# Patient Record
Sex: Female | Born: 1990 | Race: Black or African American | Hispanic: No | Marital: Married | Smoking: Never smoker
Health system: Southern US, Community
[De-identification: ages and names within clinical notes are randomized; demographics above are authoritative.]

## PROBLEM LIST (undated history)

## (undated) ENCOUNTER — Emergency Department (HOSPITAL_COMMUNITY): Admission: EM | Payer: Managed Care, Other (non HMO) | Source: Home / Self Care

## (undated) DIAGNOSIS — Z973 Presence of spectacles and contact lenses: Secondary | ICD-10-CM

## (undated) DIAGNOSIS — J45909 Unspecified asthma, uncomplicated: Secondary | ICD-10-CM

## (undated) DIAGNOSIS — O139 Gestational [pregnancy-induced] hypertension without significant proteinuria, unspecified trimester: Secondary | ICD-10-CM

## (undated) DIAGNOSIS — I2699 Other pulmonary embolism without acute cor pulmonale: Secondary | ICD-10-CM

## (undated) DIAGNOSIS — I10 Essential (primary) hypertension: Secondary | ICD-10-CM

## (undated) DIAGNOSIS — N879 Dysplasia of cervix uteri, unspecified: Secondary | ICD-10-CM

## (undated) HISTORY — PX: NO PAST SURGERIES: SHX2092

---
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
## 2019-10-10 ENCOUNTER — Emergency Department (HOSPITAL_BASED_OUTPATIENT_CLINIC_OR_DEPARTMENT_OTHER)
Admission: EM | Admit: 2019-10-10 | Discharge: 2019-10-10 | Disposition: A | Discharge status: Home / Self Care | Payer: Managed Care, Other (non HMO) | Attending: Emergency Medicine | Admitting: Emergency Medicine

## 2019-10-10 ENCOUNTER — Other Ambulatory Visit: Payer: Self-pay

## 2019-10-10 ENCOUNTER — Encounter (HOSPITAL_BASED_OUTPATIENT_CLINIC_OR_DEPARTMENT_OTHER): Payer: Self-pay

## 2019-10-10 DIAGNOSIS — Z3A01 Less than 8 weeks gestation of pregnancy: Secondary | ICD-10-CM

## 2019-10-10 DIAGNOSIS — R519 Headache, unspecified: Secondary | ICD-10-CM | POA: Diagnosis not present

## 2019-10-10 DIAGNOSIS — O99891 Other specified diseases and conditions complicating pregnancy: Secondary | ICD-10-CM | POA: Diagnosis present

## 2019-10-10 DIAGNOSIS — I10 Essential (primary) hypertension: Secondary | ICD-10-CM

## 2019-10-10 DIAGNOSIS — O10011 Pre-existing essential hypertension complicating pregnancy, first trimester: Secondary | ICD-10-CM | POA: Insufficient documentation

## 2019-10-10 LAB — PREGNANCY, URINE: Preg Test, Ur: POSITIVE — AB

## 2019-10-10 MED ORDER — LABETALOL HCL 100 MG PO TABS: 100.0000 mg | ORAL_TABLET | Freq: Two times a day (BID) | ORAL | 0 refills | Status: DC

## 2019-10-10 MED ORDER — METOCLOPRAMIDE HCL 10 MG PO TABS: 10.0000 mg | ORAL_TABLET | Freq: Three times a day (TID) | ORAL | 0 refills | Status: DC | PRN

## 2019-10-10 NOTE — ED Triage Notes (Signed)
C/o HA x 1-2 weeks-sates she has no hx except with HTN during pregnancy ~1.5 yrs ago-she has not had f/u-NAD-steady gait

## 2019-10-12 NOTE — ED Provider Notes (Signed)
[facility] EMERGENCY DEPARTMENT Provider Note   CSN: [id number] Arrival date & time: 10/10/19  1721     History   Chief Complaint Chief Complaint  Patient presents with  . Headache    HPI Laura Sutton is a 28 y.o. female.     HPI   28 year old female with multiple complaints.  Mainly concerned that she is pregnant.  She reports a headache for the past 1 to 2 weeks.  She feels like she might have high blood pressure.  She reports history of hypertension during pregnancy but resolved after delivery.  Last period at approximately 6 weeks ago.  No fevers or chills.  No visual complaints.  She has been nauseated.  No vomiting.  History reviewed. No pertinent past medical history.  There are no active problems to display for this patient.   History reviewed. No pertinent surgical history.   OB History   No obstetric history on file.      Home Medications    Prior to Admission medications   Medication Sig Start Date End Date Taking? Authorizing Provider  labetalol (NORMODYNE) 100 MG tablet Take 1 tablet (100 mg total) by mouth 2 (two) times daily. 10/10/19   Raeford Razor, MD  metoCLOPramide (REGLAN) 10 MG tablet Take 1 tablet (10 mg total) by mouth every 8 (eight) hours as needed for nausea. 10/10/19   Raeford Razor, MD    Family History No family history on file.  Social History Social History   Tobacco Use  . Smoking status: Never Smoker  . Smokeless tobacco: Never Used  Substance Use Topics  . Alcohol use: Never    Frequency: Never  . Drug use: Never     Allergies   Patient has no known allergies.   Review of Systems Review of Systems  All systems reviewed and negative, other than as noted in HPI.  Physical Exam Updated Vital Signs BP (!) 140/117 (BP Location: Right Arm)   Pulse 90   Temp 99.2 F (37.3 C) (Oral)   Resp 20   Ht 5\' 3"  (1.6 m)   Wt 67.6 kg   LMP 09/02/2019   SpO2 100%   BMI 26.39 kg/m   Physical Exam  Vitals signs and nursing note reviewed.  Constitutional:      General: She is not in acute distress.    Appearance: She is well-developed.  HENT:     Head: Normocephalic and atraumatic.  Eyes:     General:        Right eye: No discharge.        Left eye: No discharge.     Conjunctiva/sclera: Conjunctivae normal.  Neck:     Musculoskeletal: Neck supple. No neck rigidity.  Cardiovascular:     Rate and Rhythm: Normal rate and regular rhythm.     Heart sounds: Normal heart sounds. No murmur. No friction rub. No gallop.   Pulmonary:     Effort: Pulmonary effort is normal. No respiratory distress.     Breath sounds: Normal breath sounds.  Abdominal:     General: There is no distension.     Palpations: Abdomen is soft.     Tenderness: There is no abdominal tenderness.  Musculoskeletal:        General: No tenderness.  Lymphadenopathy:     Cervical: No cervical adenopathy.  Skin:    General: Skin is warm and dry.  Neurological:     General: No focal deficit present.     Mental Status: She  is alert. She is disoriented.     Cranial Nerves: No cranial nerve deficit.     Sensory: No sensory deficit.     Motor: No weakness.     Coordination: Coordination normal.  Psychiatric:        Behavior: Behavior normal.        Thought Content: Thought content normal.      ED Treatments / Results  Labs (all labs ordered are listed, but only abnormal results are displayed) Labs Reviewed  PREGNANCY, URINE - Abnormal; Notable for the following components:      Result Value   Preg Test, Ur POSITIVE (*)    All other components within normal limits    EKG None  Radiology No results found.  Procedures Procedures (including critical care time)  Medications Ordered in ED Medications - No data to display   Initial Impression / Assessment and Plan / ED Course  I have reviewed the triage vital signs and the nursing notes.  Pertinent labs & imaging results that were available during my  care of the patient were reviewed by me and considered in my medical decision making (see chart for details).        28 year old female with headache, hypertension and pregnancy.  Approximate 6 weeks by dates.  History of hypertension during previous pregnancy.  Will place her on labetalol for the time being.  She needs to establish care with new OB.  Neuro exam is nonfocal.  Afebrile.  No meningismus.  Final Clinical Impressions(s) / ED Diagnoses   Final diagnoses:  Hypertension, unspecified type  Less than [redacted] weeks gestation of pregnancy  Nonintractable headache, unspecified chronicity pattern, unspecified headache type    ED Discharge Orders         Ordered    labetalol (NORMODYNE) 100 MG tablet  2 times daily     10/10/19 1838    metoCLOPramide (REGLAN) 10 MG tablet  Every 8 hours PRN     10/10/19 Chapman Fitch, MD 10/12/19 2049

## 2019-10-26 LAB — OB RESULTS CONSOLE ABO/RH: RH Type: POSITIVE

## 2019-10-26 LAB — OB RESULTS CONSOLE RUBELLA ANTIBODY, IGM: Rubella: IMMUNE

## 2019-10-26 LAB — OB RESULTS CONSOLE GC/CHLAMYDIA
Chlamydia: NEGATIVE
Gonorrhea: NEGATIVE

## 2019-10-26 LAB — OB RESULTS CONSOLE HEPATITIS B SURFACE ANTIGEN: Hepatitis B Surface Ag: NEGATIVE

## 2019-10-26 LAB — OB RESULTS CONSOLE HIV ANTIBODY (ROUTINE TESTING): HIV: NONREACTIVE

## 2019-10-26 LAB — OB RESULTS CONSOLE RPR: RPR: NONREACTIVE

## 2019-10-26 LAB — OB RESULTS CONSOLE ANTIBODY SCREEN: Antibody Screen: NEGATIVE

## 2019-12-21 NOTE — L&D Delivery Note (Signed)
Patient was C/C/+2 and pushed for 5  minutes with epidural.    NSVD female infant, Apgars pending NICU assignment, weight 4#3.   The patient had no laceration. Fundus was firm. EBL was expected amount. Placenta was delivered intact. Vagina was clear.  Delayed cord clamping not performed, cord clamped cut and baby handed off immediately to NICU.   Philip Aspen

## 2020-05-01 ENCOUNTER — Inpatient Hospital Stay (HOSPITAL_BASED_OUTPATIENT_CLINIC_OR_DEPARTMENT_OTHER): Payer: Managed Care, Other (non HMO)

## 2020-05-01 ENCOUNTER — Encounter (HOSPITAL_COMMUNITY): Payer: Self-pay | Admitting: Obstetrics and Gynecology

## 2020-05-01 ENCOUNTER — Observation Stay (HOSPITAL_COMMUNITY)
Admission: AD | Admit: 2020-05-01 | Discharge: 2020-05-03 | DRG: 833 | Disposition: A | Discharge status: Home / Self Care | Payer: Managed Care, Other (non HMO) | Attending: Obstetrics & Gynecology | Admitting: Obstetrics & Gynecology

## 2020-05-01 ENCOUNTER — Other Ambulatory Visit: Payer: Self-pay

## 2020-05-01 DIAGNOSIS — O1413 Severe pre-eclampsia, third trimester: Secondary | ICD-10-CM | POA: Diagnosis not present

## 2020-05-01 DIAGNOSIS — O289 Unspecified abnormal findings on antenatal screening of mother: Secondary | ICD-10-CM | POA: Diagnosis not present

## 2020-05-01 DIAGNOSIS — O288 Other abnormal findings on antenatal screening of mother: Secondary | ICD-10-CM

## 2020-05-01 DIAGNOSIS — O365930 Maternal care for other known or suspected poor fetal growth, third trimester, not applicable or unspecified: Secondary | ICD-10-CM | POA: Diagnosis present

## 2020-05-01 DIAGNOSIS — Z3A34 34 weeks gestation of pregnancy: Secondary | ICD-10-CM

## 2020-05-01 DIAGNOSIS — O133 Gestational [pregnancy-induced] hypertension without significant proteinuria, third trimester: Secondary | ICD-10-CM | POA: Diagnosis not present

## 2020-05-01 DIAGNOSIS — O1493 Unspecified pre-eclampsia, third trimester: Secondary | ICD-10-CM

## 2020-05-01 DIAGNOSIS — Z20822 Contact with and (suspected) exposure to covid-19: Secondary | ICD-10-CM | POA: Diagnosis present

## 2020-05-01 DIAGNOSIS — O10013 Pre-existing essential hypertension complicating pregnancy, third trimester: Secondary | ICD-10-CM | POA: Diagnosis present

## 2020-05-01 DIAGNOSIS — O10919 Unspecified pre-existing hypertension complicating pregnancy, unspecified trimester: Secondary | ICD-10-CM | POA: Diagnosis present

## 2020-05-01 LAB — COMPREHENSIVE METABOLIC PANEL
ALT: 20 U/L (ref 0–44)
AST: 25 U/L (ref 15–41)
Albumin: 2.7 g/dL — ABNORMAL LOW (ref 3.5–5.0)
Alkaline Phosphatase: 83 U/L (ref 38–126)
Anion gap: 7 (ref 5–15)
BUN: 14 mg/dL (ref 6–20)
CO2: 21 mmol/L — ABNORMAL LOW (ref 22–32)
Calcium: 9.2 mg/dL (ref 8.9–10.3)
Chloride: 107 mmol/L (ref 98–111)
Creatinine, Ser: 0.56 mg/dL (ref 0.44–1.00)
GFR calc Af Amer: >60 mL/min (ref >60)
GFR calc non Af Amer: >60 mL/min (ref >60)
Glucose, Bld: 90 mg/dL (ref 70–99)
Potassium: 4 mmol/L (ref 3.5–5.1)
Sodium: 135 mmol/L (ref 135–145)
Total Bilirubin: 0.5 mg/dL (ref 0.3–1.2)
Total Protein: 6.1 g/dL — ABNORMAL LOW (ref 6.5–8.1)

## 2020-05-01 LAB — URINALYSIS, ROUTINE W REFLEX MICROSCOPIC
Bilirubin Urine: NEGATIVE
Glucose, UA: NEGATIVE mg/dL
Hgb urine dipstick: NEGATIVE
Ketones, ur: NEGATIVE mg/dL
Leukocytes,Ua: NEGATIVE
Nitrite: NEGATIVE
Protein, ur: NEGATIVE mg/dL
Specific Gravity, Urine: 1.008 (ref 1.005–1.030)
pH: 6 (ref 5.0–8.0)

## 2020-05-01 LAB — CBC WITH DIFFERENTIAL/PLATELET
Abs Immature Granulocytes: 0.05 10*3/uL (ref 0.00–0.07)
Basophils Absolute: 0 10*3/uL (ref 0.0–0.1)
Basophils Relative: 0 %
Eosinophils Absolute: 0.1 10*3/uL (ref 0.0–0.5)
Eosinophils Relative: 2 %
HCT: 37.3 % (ref 36.0–46.0)
Hemoglobin: 12.2 g/dL (ref 12.0–15.0)
Immature Granulocytes: 1 %
Lymphocytes Relative: 24 %
Lymphs Abs: 1.4 10*3/uL (ref 0.7–4.0)
MCH: 30.6 pg (ref 26.0–34.0)
MCHC: 32.7 g/dL (ref 30.0–36.0)
MCV: 93.5 fL (ref 80.0–100.0)
Monocytes Absolute: 0.5 10*3/uL (ref 0.1–1.0)
Monocytes Relative: 8 %
Neutro Abs: 4 10*3/uL (ref 1.7–7.7)
Neutrophils Relative %: 65 %
Platelets: 154 10*3/uL (ref 150–400)
RBC: 3.99 MIL/uL (ref 3.87–5.11)
RDW: 15.1 % (ref 11.5–15.5)
WBC: 6.1 10*3/uL (ref 4.0–10.5)
nRBC: 0 % (ref 0.0–0.2)

## 2020-05-01 LAB — ABO/RH: ABO/RH(D): O POS

## 2020-05-01 LAB — TYPE AND SCREEN
ABO/RH(D): O POS
Antibody Screen: NEGATIVE

## 2020-05-01 LAB — PROTEIN / CREATININE RATIO, URINE
Creatinine, Urine: 31.81 mg/dL
Total Protein, Urine: <6 mg/dL

## 2020-05-01 LAB — GROUP B STREP BY PCR: Group B strep by PCR: NEGATIVE

## 2020-05-01 LAB — SARS CORONAVIRUS 2 BY RT PCR (HOSPITAL ORDER, PERFORMED IN ~~LOC~~ HOSPITAL LAB): SARS Coronavirus 2: NEGATIVE

## 2020-05-01 MED ORDER — BETAMETHASONE SOD PHOS & ACET 6 (3-3) MG/ML IJ SUSP
12.0000 mg | Freq: Once | INTRAMUSCULAR | Status: AC
  Administered 2020-05-01: 12 mg via INTRAMUSCULAR
  Filled 2020-05-01: qty 5

## 2020-05-01 MED ORDER — BETAMETHASONE SOD PHOS & ACET 6 (3-3) MG/ML IJ SUSP
12.0000 mg | INTRAMUSCULAR | Status: AC
  Administered 2020-05-02: 12 mg via INTRAMUSCULAR

## 2020-05-01 MED ORDER — ACETAMINOPHEN 325 MG PO TABS
650.0000 mg | ORAL_TABLET | ORAL | Status: DC | PRN
  Administered 2020-05-01 – 2020-05-02 (×3): 650 mg via ORAL
  Filled 2020-05-01 (×3): qty 2

## 2020-05-01 MED ORDER — LABETALOL HCL 5 MG/ML IV SOLN
80.0000 mg | INTRAVENOUS | Status: DC | PRN
  Administered 2020-05-01: 80 mg via INTRAVENOUS
  Filled 2020-05-01: qty 16

## 2020-05-01 MED ORDER — LABETALOL HCL 5 MG/ML IV SOLN
20.0000 mg | INTRAVENOUS | Status: DC | PRN
  Administered 2020-05-01: 20 mg via INTRAVENOUS
  Filled 2020-05-01: qty 4

## 2020-05-01 MED ORDER — NIFEDIPINE ER OSMOTIC RELEASE 30 MG PO TB24
30.0000 mg | ORAL_TABLET | Freq: Every day | ORAL | Status: DC
  Administered 2020-05-01 – 2020-05-03 (×3): 30 mg via ORAL
  Filled 2020-05-01 (×3): qty 1

## 2020-05-01 MED ORDER — PRENATAL MULTIVITAMIN CH
1.0000 | ORAL_TABLET | Freq: Every day | ORAL | Status: DC
  Administered 2020-05-02 – 2020-05-03 (×2): 1 via ORAL
  Filled 2020-05-01 (×2): qty 1

## 2020-05-01 MED ORDER — HYDRALAZINE HCL 20 MG/ML IJ SOLN
10.0000 mg | INTRAMUSCULAR | Status: DC | PRN
  Administered 2020-05-01: 10 mg via INTRAVENOUS
  Filled 2020-05-01: qty 1

## 2020-05-01 MED ORDER — DOCUSATE SODIUM 100 MG PO CAPS
100.0000 mg | ORAL_CAPSULE | Freq: Every day | ORAL | Status: DC
  Administered 2020-05-02 – 2020-05-03 (×2): 100 mg via ORAL
  Filled 2020-05-01 (×2): qty 1

## 2020-05-01 MED ORDER — LABETALOL HCL 5 MG/ML IV SOLN
40.0000 mg | INTRAVENOUS | Status: DC | PRN
  Administered 2020-05-01: 40 mg via INTRAVENOUS
  Filled 2020-05-01: qty 8

## 2020-05-01 MED ORDER — LABETALOL HCL 200 MG PO TABS
200.0000 mg | ORAL_TABLET | Freq: Two times a day (BID) | ORAL | Status: DC
  Administered 2020-05-01 – 2020-05-03 (×4): 200 mg via ORAL
  Filled 2020-05-01 (×4): qty 1

## 2020-05-01 MED ORDER — LACTATED RINGERS IV SOLN: INTRAVENOUS | Status: DC

## 2020-05-01 MED ORDER — ZOLPIDEM TARTRATE 5 MG PO TABS
5.0000 mg | ORAL_TABLET | Freq: Every evening | ORAL | Status: DC | PRN
  Administered 2020-05-02: 5 mg via ORAL
  Filled 2020-05-01: qty 1

## 2020-05-01 MED ORDER — CALCIUM CARBONATE ANTACID 500 MG PO CHEW: 2.0000 | CHEWABLE_TABLET | ORAL | Status: DC | PRN

## 2020-05-01 NOTE — MAU Provider Note (Signed)
History     CSN: [id number]  Arrival date and time: 05/01/20 1525   First Provider Initiated Contact with Patient 05/01/20 1559      Chief Complaint  Patient presents with  . Monitoring   HPI  Ms. Laura Sutton is a 29 y.o. [id number] at [redacted]w[redacted]d who presents to MAU today from the office for continued fetal monitoring. The patient states that she has a history of GHTN in her previous pregnancy. She had NST in the office today and was non-reactive. She denies elevated blood pressure this pregnancy. She is on Labetalol which she took today. She denies HA, blurred vision, RUQ abdominal pain or LE edema today. She denies VB, LOF or contractions. She reports normal fetal movement. She has had both previous children at 36 weeks, first was due to PTL and second was GHTN and IOL.   OB History    Gravida  3   Para  2   Term      Preterm  2   AB      Living  2     SAB      TAB      Ectopic      Multiple      Live Births  2           Past Medical History:  Diagnosis Date  . Hypertension     History reviewed. No pertinent surgical history.  History reviewed. No pertinent family history.  Social History   Tobacco Use  . Smoking status: Never Smoker  . Smokeless tobacco: Never Used  Substance Use Topics  . Alcohol use: Never  . Drug use: Never    Allergies: No Known Allergies  Medications Prior to Admission  Medication Sig Dispense Refill Last Dose  . ferrous sulfate 325 (65 FE) MG tablet Take 325 mg by mouth daily with breakfast.   05/01/2020 at Unknown time  . labetalol (NORMODYNE) 100 MG tablet Take 1 tablet (100 mg total) by mouth 2 (two) times daily. 60 tablet 0 05/01/2020 at Unknown time  . Prenatal Vit-Fe Fumarate-FA (PRENATAL MULTIVITAMIN) TABS tablet Take 1 tablet by mouth daily at 12 noon.   05/01/2020 at Unknown time  . metoCLOPramide (REGLAN) 10 MG tablet Take 1 tablet (10 mg total) by mouth every 8 (eight) hours as needed for nausea. 20 tablet 0      Review of Systems  Constitutional: Negative for fever.  Eyes: Negative for visual disturbance.  Cardiovascular: Negative for leg swelling.  Gastrointestinal: Negative for abdominal pain, constipation, diarrhea, nausea and vomiting.  Genitourinary: Negative for vaginal bleeding and vaginal discharge.  Neurological: Negative for headaches.   Physical Exam   Blood pressure (!) 184/100, pulse 64, temperature 98.4 F (36.9 C), temperature source Oral, resp. rate 16, height 5\' 3"  (1.6 m), weight 82.9 kg, last menstrual period 09/02/2019, SpO2 100 %.  Physical Exam  Nursing note and vitals reviewed. Constitutional: She is oriented to person, place, and time. She appears well-developed and well-nourished. No distress.  HENT:  Head: Normocephalic and atraumatic.  Cardiovascular: Normal rate.  Respiratory: Effort normal.  GI: Soft. She exhibits no distension and no mass. There is no abdominal tenderness. There is no rebound and no guarding.  Musculoskeletal:        General: No edema.  Neurological: She is alert and oriented to person, place, and time. She has normal reflexes.  No clonus  Skin: Skin is warm and dry. No erythema.  Psychiatric: She has a normal mood and  affect.    Results for orders placed or performed during the hospital encounter of 05/01/20 (from the past 24 hour(s))  Urinalysis, Routine w reflex microscopic     Status: Abnormal   Collection Time: 05/01/20  3:43 PM  Result Value Ref Range   Color, Urine STRAW (A) YELLOW   APPearance CLEAR CLEAR   Specific Gravity, Urine 1.008 1.005 - 1.030   pH 6.0 5.0 - 8.0   Glucose, UA NEGATIVE NEGATIVE mg/dL   Hgb urine dipstick NEGATIVE NEGATIVE   Bilirubin Urine NEGATIVE NEGATIVE   Ketones, ur NEGATIVE NEGATIVE mg/dL   Protein, ur NEGATIVE NEGATIVE mg/dL   Nitrite NEGATIVE NEGATIVE   Leukocytes,Ua NEGATIVE NEGATIVE  Protein / creatinine ratio, urine     Status: None   Collection Time: 05/01/20  4:06 PM  Result Value  Ref Range   Creatinine, Urine 31.81 mg/dL   Total Protein, Urine <6 mg/dL   Protein Creatinine Ratio        0.00 - 0.15 mg/mg[Cre]  CBC with Differential/Platelet     Status: None   Collection Time: 05/01/20  4:13 PM  Result Value Ref Range   WBC 6.1 4.0 - 10.5 K/uL   RBC 3.99 3.87 - 5.11 MIL/uL   Hemoglobin 12.2 12.0 - 15.0 g/dL   HCT 55.9 74.1 - 63.8 %   MCV 93.5 80.0 - 100.0 fL   MCH 30.6 26.0 - 34.0 pg   MCHC 32.7 30.0 - 36.0 g/dL   RDW 45.3 64.6 - 80.3 %   Platelets 154 150 - 400 K/uL   nRBC 0.0 0.0 - 0.2 %   Neutrophils Relative % 65 %   Neutro Abs 4.0 1.7 - 7.7 K/uL   Lymphocytes Relative 24 %   Lymphs Abs 1.4 0.7 - 4.0 K/uL   Monocytes Relative 8 %   Monocytes Absolute 0.5 0.1 - 1.0 K/uL   Eosinophils Relative 2 %   Eosinophils Absolute 0.1 0.0 - 0.5 K/uL   Basophils Relative 0 %   Basophils Absolute 0.0 0.0 - 0.1 K/uL   Immature Granulocytes 1 %   Abs Immature Granulocytes 0.05 0.00 - 0.07 K/uL  Comprehensive metabolic panel     Status: Abnormal   Collection Time: 05/01/20  4:13 PM  Result Value Ref Range   Sodium 135 135 - 145 mmol/L   Potassium 4.0 3.5 - 5.1 mmol/L   Chloride 107 98 - 111 mmol/L   CO2 21 (L) 22 - 32 mmol/L   Glucose, Bld 90 70 - 99 mg/dL   BUN 14 6 - 20 mg/dL   Creatinine, Ser 2.12 0.44 - 1.00 mg/dL   Calcium 9.2 8.9 - 24.8 mg/dL   Total Protein 6.1 (L) 6.5 - 8.1 g/dL   Albumin 2.7 (L) 3.5 - 5.0 g/dL   AST 25 15 - 41 U/L   ALT 20 0 - 44 U/L   Alkaline Phosphatase 83 38 - 126 U/L   Total Bilirubin 0.5 0.3 - 1.2 mg/dL   GFR calc non Af Amer >60 >60 mL/min   GFR calc Af Amer >60 >60 mL/min   Anion gap 7 5 - 15    Fetal Monitoring: Baseline: 130 bpm Variability: moderate Accelerations: 10 x 10 only until after BPP, when placed back on monitor few 15 x 15 noted Decelerations: none Contractions: few, irregular   Patient Vitals for the past 24 hrs:  BP Temp Temp src Pulse Resp SpO2 Height Weight  05/01/20 1731 (!) 184/100 - - 64 - - - -  05/01/20 1730 (!) 187/114 - - 72 - - - -  05/01/20 1631 (!) 158/95 - - 74 - - - -  05/01/20 1616 (!) 160/100 - - 71 - - - -  05/01/20 1555 (!) 141/101 - - 80 - - - -  05/01/20 1540 (!) 160/108 98.4 F (36.9 C) Oral 86 16 100 % - -  05/01/20 1536 - - - - - - 5\' 3"  (1.6 m) 82.9 kg    MAU Course  Procedures None  MDM CBC, CMP, UA, urine protein/creatinine ratio BPP ordered  Consulted with Dr. , agrees with plan for admission Discussed with Dr. Alysia Penna. She has consulted with NICU who agree with admission at [facility]. Initial dose of BMZ given in MAU. COVID testing collected. Patient admitted to [facility].   Assessment and Plan  A: [facility] at [redacted]w[redacted]d Pre-eclampsia with severe features  P:  Admit to [facility] for observation and BP management    [facility], PA-C 05/01/2020, 5:52 PM

## 2020-05-01 NOTE — Progress Notes (Signed)
Called Dr. Parke Poisson to discuss patient newly admitted for observation.  While in MAU multiple severe range BPs requiring full course of IV labetalol and hydralazine.  BP not indication for delivery at this time, Dr. Parke Poisson recommends either increasing labetalol or adding procardia 30mg  XL, and increasing dosing to keep in mild range. MgSO4 not recommended at this time. Indications for delivery would be NRFHT or visual changes in setting of normal labs.  He recommends growth scan and 24hr urine protein.  Dr. recommends keeping pt on cont. Monitoring for 4 hours.  If no decels and some intermittent accels and BPs come under better control, then ok to d/c monitoring overnight.

## 2020-05-01 NOTE — H&P (Addendum)
29 y.o. [redacted]w[redacted]d  [id number] comes in from office for NST.  She has likely CHTN dx in pregnancy and has been on Labetalol 200mg  bid.  NST was nonreactive and no was available in office so she was sent to [facility] for additional monitoring.  On arrival pt had a severe range BP f/b mild range BP.  She then had another severe range and 20mg  IV labetalol was administered.  Otherwise has good fetal movement and no bleeding.  She denies HA, vision change/RUQ pain.  Past Medical History:  Diagnosis Date  . Hypertension    History reviewed. No pertinent surgical history.  OB History  Gravida Para Term Preterm AB Living  3 2   2   2   SAB TAB Ectopic Multiple Live Births          2    # Outcome Date GA Lbr Len/2nd Weight Sex Delivery Anes PTL Lv  3 Current           2 Preterm 02/03/18    M Vag-Spont     1 Preterm 05/19/15    F Vag-Spont       Social History   Socioeconomic History  . Marital status: Married    Spouse name: Not on file  . Number of children: Not on file  . Years of education: Not on file  . Highest education level: Not on file  Occupational History  . Not on file  Tobacco Use  . Smoking status: Never Smoker  . Smokeless tobacco: Never Used  Substance and Sexual Activity  . Alcohol use: Never  . Drug use: Never  . Sexual activity: Not on file  Other Topics Concern  . Not on file  Social History Narrative  . Not on file   Social Determinants of Health   Financial Resource Strain:   . Difficulty of Paying Living Expenses:   Food Insecurity:   . Worried About in the Last Year:   . 02/05/18 in the Last Year:   Transportation Needs:   . 05/21/15 (Medical):   Programme researcher, broadcasting/film/video Lack of Transportation (Non-Medical):   Physical Activity:   . Days of Exercise per Week:   . Minutes of Exercise per Session:   Stress:   . Feeling of Stress :   Social Connections:   . Frequency of Communication with Friends and Family:   . Frequency of Social Gatherings  with Friends and Family:   . Attends Religious Services:   . Active Member of Clubs or Organizations:   . Attends Barista Meetings:   Freight forwarder Marital Status:   Intimate Partner Violence:   . Fear of Current or Ex-Partner:   . Emotionally Abused:   Marland Kitchen Physically Abused:   . Sexually Abused:    Patient has no known allergies.    Prenatal Transfer Tool  Maternal Diabetes: No Genetic Screening: Declined Maternal Ultrasounds/Referrals: Other: initial anatomy scan suspcious for abnormal heart views Fetal Ultrasounds or other Referrals:  Referred to Materal Fetal Medicine : f/u anatomy showed normal appearance of heart, fetal ECHO offered to pt and it was declined Maternal Substance Abuse:  No Significant Maternal Medications:  Meds include: Other: labetalol 200mg  bid Significant Maternal Lab Results: Other: GBS collected in MAU, pending  Other PNC: h.o preterm birth x 2 (PPROM at 67.3, the other also 36.3 for NRHFT    Vitals:   05/01/20 1540 05/01/20 1555 05/01/20 1616 05/01/20 1631  BP: (!) 160/108 05/03/20)  141/101 (!) 160/100 (!) 158/95  Pulse: 86 80 71 74  Resp: 16     Temp: 98.4 F (36.9 C)     TempSrc: Oral     SpO2: 100%     Weight:      Height:        Lungs/Cor:  NAD Abdomen:  soft, gravid Ex:  no cords, erythema SVE:  deferred FHTs:  125, good STV, NST R; Cat 1 tracing, currently, upon arrive nonreactive Toco:  Quite  [place] today: BPP 8/8 [place] 31 weeks 44%   A/P  Admit for observation for severe range BPs ALT/AST/Plt normal, protein negative BPP 8/8 Betamethasone x2 IV antihypertensive protocol prn, received 20mg  IV labetalol in MAU Given BPP 8/8 and currently reactive NST, plan for NST qshift Will place MFM consult for delivery decision making  GBS pending Cont toco/FHT Other routine care  Allyn Kenner

## 2020-05-01 NOTE — MAU Note (Signed)
Laura Sutton is a 29 y.o. at [redacted]w[redacted]d here in MAU reporting: was at the office today for NST and they sent her over for further monitoring. Has been having some elevated BP, is on labetolol. +FM  Onset of complaint: today  Pain score: 0/10  Vitals:   05/01/20 1540  BP: (!) 160/108  Pulse: 86  Resp: 16  Temp: 98.4 F (36.9 C)  SpO2: 100%     FHT: 130  Lab orders placed from triage: UA

## 2020-05-02 ENCOUNTER — Inpatient Hospital Stay (HOSPITAL_BASED_OUTPATIENT_CLINIC_OR_DEPARTMENT_OTHER): Payer: Managed Care, Other (non HMO)

## 2020-05-02 DIAGNOSIS — Z8279 Family history of other congenital malformations, deformations and chromosomal abnormalities: Secondary | ICD-10-CM

## 2020-05-02 DIAGNOSIS — O10013 Pre-existing essential hypertension complicating pregnancy, third trimester: Secondary | ICD-10-CM | POA: Diagnosis not present

## 2020-05-02 DIAGNOSIS — Z362 Encounter for other antenatal screening follow-up: Secondary | ICD-10-CM

## 2020-05-02 DIAGNOSIS — Z3A34 34 weeks gestation of pregnancy: Secondary | ICD-10-CM | POA: Diagnosis not present

## 2020-05-02 DIAGNOSIS — Z363 Encounter for antenatal screening for malformations: Secondary | ICD-10-CM | POA: Diagnosis not present

## 2020-05-02 DIAGNOSIS — O10919 Unspecified pre-existing hypertension complicating pregnancy, unspecified trimester: Secondary | ICD-10-CM

## 2020-05-02 HISTORY — DX: Unspecified pre-existing hypertension complicating pregnancy, unspecified trimester: O10.919

## 2020-05-02 LAB — CBC
HCT: 37.1 % (ref 36.0–46.0)
Hemoglobin: 12.2 g/dL (ref 12.0–15.0)
MCH: 30.7 pg (ref 26.0–34.0)
MCHC: 32.9 g/dL (ref 30.0–36.0)
MCV: 93.2 fL (ref 80.0–100.0)
Platelets: 174 10*3/uL (ref 150–400)
RBC: 3.98 MIL/uL (ref 3.87–5.11)
RDW: 15.5 % (ref 11.5–15.5)
WBC: 11.1 10*3/uL — ABNORMAL HIGH (ref 4.0–10.5)
nRBC: 0 % (ref 0.0–0.2)

## 2020-05-02 LAB — COMPREHENSIVE METABOLIC PANEL
ALT: 22 U/L (ref 0–44)
AST: 24 U/L (ref 15–41)
Albumin: 3.1 g/dL — ABNORMAL LOW (ref 3.5–5.0)
Alkaline Phosphatase: 82 U/L (ref 38–126)
Anion gap: 9 (ref 5–15)
BUN: 6 mg/dL (ref 6–20)
CO2: 18 mmol/L — ABNORMAL LOW (ref 22–32)
Calcium: 9.1 mg/dL (ref 8.9–10.3)
Chloride: 107 mmol/L (ref 98–111)
Creatinine, Ser: 0.77 mg/dL (ref 0.44–1.00)
GFR calc Af Amer: >60 mL/min (ref >60)
GFR calc non Af Amer: >60 mL/min (ref >60)
Glucose, Bld: 97 mg/dL (ref 70–99)
Potassium: 3.9 mmol/L (ref 3.5–5.1)
Sodium: 134 mmol/L — ABNORMAL LOW (ref 135–145)
Total Bilirubin: 0.3 mg/dL (ref 0.3–1.2)
Total Protein: 6.5 g/dL (ref 6.5–8.1)

## 2020-05-02 LAB — RPR: RPR Ser Ql: NONREACTIVE

## 2020-05-02 LAB — PROTEIN, URINE, 24 HOUR
Collection Interval-UPROT: 24 hours
Protein, 24H Urine: 245 mg/d — ABNORMAL HIGH (ref 50–100)
Protein, Urine: 7 mg/dL
Urine Total Volume-UPROT: 3500 mL

## 2020-05-02 NOTE — Progress Notes (Signed)
Laura Sutton 29 y.o. [id number] at [redacted]w[redacted]d HD#2 admitted for nonreactive NST in the office, found to have severe BPs S: Reports on/off HA, thinks it is due to procardia, tylenol helps. Denies vision changes or RUQ pain O: Patient Vitals for the past 24 hrs:  BP Temp Temp src Pulse Resp SpO2 Height Weight  05/02/20 0759 138/86 98 F (36.7 C) Oral 94 18 98 % -- --  05/02/20 0449 117/71 98 F (36.7 C) Oral 89 18 100 % -- --  05/01/20 2344 128/62 98.1 F (36.7 C) Oral (!) 106 16 100 % -- --  05/01/20 1950 (!) 147/83 98.5 F (36.9 C) Oral 100 18 100 % -- --  05/01/20 1831 (!) 150/101 98.3 F (36.8 C) -- 90 16 -- -- --  05/01/20 1816 (!) 146/90 -- -- 86 -- -- -- --  05/01/20 1810 -- -- -- -- -- 100 % -- --  05/01/20 1804 (!) 167/97 -- -- -- -- -- -- --  05/01/20 1801 (!) 167/97 -- -- 76 -- -- -- --  05/01/20 1750 (!) 167/115 -- -- 87 -- -- -- --  05/01/20 1731 (!) 184/100 -- -- 64 -- -- -- --  05/01/20 1730 (!) 187/114 -- -- 72 -- -- -- --  05/01/20 1631 (!) 158/95 -- -- 74 -- -- -- --  05/01/20 1616 (!) 160/100 -- -- 71 -- -- -- --  05/01/20 1555 (!) 141/101 -- -- 80 -- -- -- --  05/01/20 1540 (!) 160/108 98.4 F (36.9 C) Oral 86 16 100 % -- --  05/01/20 1536 -- -- -- -- -- -- 5\' 3"  (1.6 m) 82.9 kg   NST FHR 130, 15x15, no decels toco quiet  Recent Labs    05/01/20 1613  WBC 6.1  HGB 12.2  HCT 37.3  PLT 154    Recent Labs    05/01/20 1613  NA 135  K 4.0  CL 107  BUN 14  CREATININE 0.56  GLUCOSE 90  BILITOT 0.5  ALT 20  AST 25  ALKPHOS 83  PROT 6.1*  ALBUMIN 2.7*    Recent Labs    05/01/20 1613  CALCIUM 9.2    A/p: 05/03/20 29 y.o. [name] at [redacted]w[redacted]d HD#2 admitted for nonreactive NST in office and severe BP on arrival to hospital in s/o CHTN 1. CHTN: dx in pregnancy prior to 20w, on labetalol 200mg  BID, on admit severe BP, required multiple IV meds, now on labetalol 200mg  BID and procardia 30 xL.  -CBC/CMP WNL, repeat ordered for this  afternoon -24h urine pending 2. Fetal testing: office NST nonreactive. Extended fetal monitoring yesterday reassuring but 10x10 accels not 15x15, BPP 8/8 -Resume continuous monitoring today, currently reactive.  3. Prematurity: BMZ 5/13 1800, ordered #2 -growth pending [name] 05/02/20 10:29 AM

## 2020-05-02 NOTE — Progress Notes (Signed)
RN received verbal order from Dr Claudine Mouton to take pt off continuous fetal monitoring. Per Dr Claudine Mouton, pt can be monitored again tonight.

## 2020-05-02 NOTE — Consult Note (Signed)
MFM Note  Laura Sutton was seen in consultation due to an exacerbation of chronic hypertension in pregnancy.  She is a 29 year old gravida 3 para 2 currently at 34 weeks and 5 days.  She was sent to the hospital yesterday after a nonreactive nonstress test was noted in the office.  Upon arrival in the MAU, the patient was noted to have severely elevated blood in the 140s to 160s over 90s to 100s range.  She required IV labetalol for blood pressure control.  The patient has a history of chronic hypertension that was diagnosed prior to the 20th week of her current pregnancy.  She was being treated with labetalol 200 mg twice a day.  After her blood pressures were controlled using IV labetalol, nifedipine 30 mg XL was added to her daily labetalol regimen.  Her blood pressures have now stabilized and are in the 130s to 140s over 80s to 90s range.  The patient is currently asymptomatic.  Her fetal heart rate tracing has been reactive.  Her [place] labs on admission were all within normal limits.  She had a P/C ratio that did not show any significant amount of protein.  A 24-hour urine collection is underway to assess the total protein.  Due to her extremely elevated blood pressures, she is receiving a complete course of antenatal corticosteroids.  Her past OB history includes two prior vaginal deliveries at around 36 weeks (Due to PPROM and due to nonreassuring fetal heart rate tracing).   She denies any prior surgical history.  She denies any significant past medical history other than chronic hypertension.    Her medications include labetalol 200 mg twice a day and Procardia XL 30 mg daily.   She had a growth ultrasound today that showed borderline fetal growth restriction with an overall EFW of 4 pounds 10 ounces (9th percentile for her gestational age).  There is also low normal amniotic fluid with a total AFI of 8.3 cm.  Doppler studies of the umbilical arteries performed today showed a slightly  elevated S/D ratio of 3.45.  There were no signs of absent or reversed end-diastolic flow.  The patient and her husband were both advised that as her [place] labs are all within normal limits and there were no significant amounts of protein noted on her P/C ratio, she most likely has an exacerbation of chronic hypertension rather than preeclampsia.  We will await her 24-hour urine results.  Due to chronic hypertension that has been difficult to control along with borderline fetal growth restriction and borderline low amniotic fluid levels, I would recommend delivery at around 36 weeks.    The patient is scheduled to receive her second dose of antenatal corticosteroids this afternoon.  She should be observed in the hospital until at least tomorrow afternoon.    Outpatient management may be considered should her blood pressures remain in the 130s to 140s over 80s to 90s range and she remains asymptomatic with reassuring fetal status.   Should outpatient management be pursued, she should be seen in your for twice weekly fetal testing and amniotic fluid checks.  An induction of labor should be scheduled for her at around 36 weeks.    The patient was advised to be off from work once she is discharged.    Delivery prior to 36 weeks would be indicated should her blood pressures be in the severe range (persistently greater than 150/ 100s), should she show any signs and symptoms of severe preeclampsia, or should there be  nonreassuring fetal status.    The patient and her husband were both happy with the management plan that I proposed today.   At the end of the consultation they stated that all their questions had been answered to their complete satisfaction.    Thank you for referring this patient for a Maternal Fetal Medicine consult.  Recommendations: Delivery at around 36 weeks  Outpatient management may be considered should her blood pressures remain in the 130s to 140s over 80s to 90s range and she  remains asymptomatic with reassuring fetal status  Continue labetalol and nifedipine for treatment of hypertension until delivery    Twice-weekly fetal testing and amniotic fluid checks should be performed until delivery  Delivery prior to 36 weeks would be indicated should her blood pressures be persistently greater than 150/100s, should she show any signs of severe preeclampsia, or should there be nonreassuring fetal status

## 2020-05-03 ENCOUNTER — Encounter (HOSPITAL_COMMUNITY): Payer: Self-pay | Admitting: Obstetrics & Gynecology

## 2020-05-03 DIAGNOSIS — O10013 Pre-existing essential hypertension complicating pregnancy, third trimester: Secondary | ICD-10-CM | POA: Diagnosis not present

## 2020-05-03 MED ORDER — NIFEDIPINE ER 30 MG PO TB24: 30.0000 mg | ORAL_TABLET | Freq: Every day | ORAL | 1 refills | Status: DC

## 2020-05-03 NOTE — Discharge Instructions (Signed)
Check blood pressure three times a day. If BP >751 systolic or >025 diastolic call your doctor or seek care.  If symptoms of preeclampsia: headache, visual changes, shortness of breath, right upper quadrant pain, seek care.   Follow up 2 times this week for fetal testing and blood pressure check Plan on delivery around 36 weeks, we will discuss this further at your office appointment   Hypertension During Pregnancy High blood pressure (hypertension) is when the force of blood pumping through the arteries is too strong. Arteries are blood vessels that carry blood from the heart throughout the body. Hypertension during pregnancy can be mild or severe. Severe hypertension during pregnancy (preeclampsia) is a medical emergency that requires prompt evaluation and treatment. Different types of hypertension can happen during pregnancy. These include:  Chronic hypertension. This happens when you had high blood pressure before you became pregnant, and it continues during the pregnancy. Hypertension that develops before you are [redacted] weeks pregnant and continues during the pregnancy is also called chronic hypertension. If you have chronic hypertension, it will not go away after you have your baby. You will need follow-up visits with your health care provider after you have your baby. Your doctor may want you to keep taking medicine for your blood pressure.  Gestational hypertension. This is hypertension that develops after the 20th week of pregnancy. Gestational hypertension usually goes away after you have your baby, but your health care provider will need to monitor your blood pressure to make sure that it is getting better.  Preeclampsia. This is severe hypertension during pregnancy. This can cause serious complications for you and your baby and can also cause complications for you after the delivery of your baby.  Postpartum preeclampsia. You may develop severe hypertension after giving birth. This usually  occurs within 48 hours after childbirth but may occur up to 6 weeks after giving birth. This is rare. How does this affect me? Women who have hypertension during pregnancy have a greater chance of developing hypertension later in life or during future pregnancies. In some cases, hypertension during pregnancy can cause serious complications, such as:  Stroke.  Heart attack.  Injury to other organs, such as kidneys, lungs, or liver.  Preeclampsia.  Convulsions or seizures.  Placental abruption. How does this affect my baby? Hypertension during pregnancy can affect your baby. Your baby may:  Be born early (prematurely).  Not weigh as much as he or she should at birth (low birth weight).  Not tolerate labor well, leading to an unplanned cesarean delivery. What are the risks? There are certain factors that make it more likely for you to develop hypertension during pregnancy. These include:  Having hypertension during a previous pregnancy.  Being overweight.  Being age 88 or older.  Being pregnant for the first time.  Being pregnant with more than one baby.  Becoming pregnant using fertilization methods, such as IVF (in vitro fertilization).  Having other medical problems, such as diabetes, kidney disease, or lupus.  Having a family history of hypertension. What can I do to lower my risk? The exact cause of hypertension during pregnancy is not known. You may be able to lower your risk by:  Maintaining a healthy weight.  Eating a healthy and balanced diet.  Following your health care provider's instructions about treating any long-term conditions that you had before becoming pregnant. It is very important to keep all of your prenatal care appointments. Your health care provider will check your blood pressure and make sure  that your pregnancy is progressing as expected. If a problem is found, early treatment can prevent complications. How is this treated? Treatment for  hypertension during pregnancy varies depending on the type of hypertension you have and how serious it is.  If you were taking medicine for high blood pressure before you became pregnant, talk with your health care provider. You may need to change medicine during pregnancy because some medicines, like ACE inhibitors, may not be considered safe for your baby.  If you have gestational hypertension, your health care provider may order medicine to treat this during pregnancy.  If you are at risk for preeclampsia, your health care provider may recommend that you take a low-dose aspirin during your pregnancy.  If you have severe hypertension, you may need to be hospitalized so you and your baby can be monitored closely. You may also need to be given medicine to lower your blood pressure. This medicine may be given by mouth or through an IV.  In some cases, if your condition gets worse, you may need to deliver your baby early. Follow these instructions at home: Eating and drinking   Drink enough fluid to keep your urine pale yellow.  Avoid caffeine. Lifestyle  Do not use any products that contain nicotine or tobacco, such as cigarettes, e-cigarettes, and chewing tobacco. If you need help quitting, ask your health care provider.  Do not use alcohol or drugs.  Avoid stress as much as possible.  Rest and get plenty of sleep.  Regular exercise can help to reduce your blood pressure. Ask your health care provider what kinds of exercise are best for you. General instructions  Take over-the-counter and prescription medicines only as told by your health care provider.  Keep all prenatal and follow-up visits as told by your health care provider. This is important. Contact a health care provider if:  You have symptoms that your health care provider told you may require more treatment or monitoring, such as: ? Headaches. ? Nausea or vomiting. ? Abdominal  pain. ? Dizziness. ? Light-headedness. Get help right away if:  You have: ? Severe abdominal pain that does not get better with treatment. ? A severe headache that does not get better. ? Vomiting that does not get better. ? Sudden, rapid weight gain. ? Sudden swelling in your hands, ankles, or face. ? Vaginal bleeding. ? Blood in your urine. ? Blurred or double vision. ? Shortness of breath or chest pain. ? Weakness on one side of your body. ? Difficulty speaking.  Your baby is not moving as much as usual. Summary  High blood pressure (hypertension) is when the force of blood pumping through the arteries is too strong.  Hypertension during pregnancy can cause problems for you and your baby.  Treatment for hypertension during pregnancy varies depending on the type of hypertension you have and how serious it is.  Keep all prenatal and follow-up visits as told by your health care provider. This is important. This information is not intended to replace advice given to you by your health care provider. Make sure you discuss any questions you have with your health care provider. Document Revised: 03/29/2019 Document Reviewed: 01/02/2019 Elsevier Patient Education  2020 [employer].

## 2020-05-03 NOTE — Progress Notes (Signed)
Laura Sutton 29 y.o. [id number] at [redacted]w[redacted]d HD#3 admitted for nonreactive NST in the office, found to have severe BPs S: Denies HA/VC/RUQ pain/SOB. Reports normal FM. No LOF/Ctx/VB O: Patient Vitals for the past 24 hrs:  BP Temp Temp src Pulse Resp SpO2  05/03/20 0754 131/81 97.9 F (36.6 C) Oral 86 18 99 %  05/02/20 2307 130/75 98.5 F (36.9 C) Oral 98 18 100 %  05/02/20 2224 126/74 -- -- (!) 104 18 100 %  05/02/20 1922 134/75 98.1 F (36.7 C) Oral 98 18 100 %  05/02/20 1556 (!) 145/87 97.9 F (36.6 C) Oral 96 17 100 %  05/02/20 1152 (!) 142/80 98.2 F (36.8 C) Oral 97 18 99 %   NST planned for today  Recent Labs    05/01/20 1613 05/02/20 1644  WBC 6.1 11.1*  HGB 12.2 12.2  HCT 37.3 37.1  PLT 154 174    Recent Labs    05/01/20 1613 05/02/20 1644  NA 135 134*  K 4.0 3.9  CL 107 107  BUN 14 6  CREATININE 0.56 0.77  GLUCOSE 90 97  BILITOT 0.5 0.3  ALT 20 22  AST 25 24  ALKPHOS 83 82  PROT 6.1* 6.5  ALBUMIN 2.7* 3.1*    Recent Labs    05/01/20 1613 05/02/20 1644  CALCIUM 9.2 9.1    A/p: Laura Sutton 29 y.o. [id number] at [redacted]w[redacted]d HD#3 admitted for nonreactive NST in office and severe BP on arrival to hospital in s/o [facility]. Monitor BP until this afternoon, if remains 130-140/80-90s, asympatomic and reactive NST will plan for discharge today.  1. CHTN: dx in pregnancy prior to 20w, on labetalol 200mg  BID, on admit severe BP, required multiple IV meds, now on labetalol 200mg  BID and procardia 30 xL.  -CBC/CMP WNL -24h urine 245mg  -Asymptomatic for preeclampsia -MFM consulted, given BP remain 130-140s/80-90s, asymptomatic will plan for outpatient management with 2x week testing and delivery around 36w. Patient aware of monitoring BP at home and symptoms of preeclampsia 2. Fetal testing: office NST nonreactive. Extended fetal monitoring yesterday reactive. 3. Prematurity: BMZ 5/13-14.  4. FGR: 5/14 EFW 9%, elevated dopplers. Per MFM 2x week testing and  delivery around 36w. Laura Sutton 05/03/20 9:37 AM

## 2020-05-03 NOTE — Plan of Care (Signed)
  Problem: Education: Goal: Knowledge of General Education information will improve Description: Including pain rating scale, medication(s)/side effects and non-pharmacologic comfort measures Outcome: Completed/Met   Problem: Activity: Goal: Risk for activity intolerance will decrease Outcome: Completed/Met   Problem: Coping: Goal: Level of anxiety will decrease Outcome: Completed/Met   Problem: Elimination: Goal: Will not experience complications related to urinary retention Outcome: Completed/Met   Problem: Pain Managment: Goal: General experience of comfort will improve Outcome: Completed/Met   Problem: Safety: Goal: Ability to remain free from injury will improve Outcome: Completed/Met   Problem: Education: Goal: Knowledge of disease or condition will improve Outcome: Completed/Met Goal: Knowledge of the prescribed therapeutic regimen will improve Outcome: Completed/Met

## 2020-05-03 NOTE — Discharge Summary (Addendum)
Physician Discharge Summary  Patient ID: Laura Sutton MRN: [id number] DOB/AGE: October 28, 1991 28 y.o.  Admit date: 05/01/2020 Discharge date: 05/03/2020  Admission Diagnoses: Nonreactive NST Severe blood pressures  Discharge Diagnoses:  Active Problems:   Chronic hypertension during pregnancy Fetal growth restriction SIUP at [redacted]w[redacted]d   Discharged Condition: good  Hospital Course: Laura Sutton [id number] at [redacted]w[redacted]d admitted on 05/01/2020. She presented to the hospital from clinic due to NST nonreactive. On admit found to have severe blood pressures, she was admitted for blood pressures and fetal monitoring. During her hospital course her fetal testing was reassuring. On 5/13 she required multiple IV antihypertensives, her home labetalol 200mg  BID was continued and procardia 30 xL was added. Her BP remained mild range after these medication adjustments. CBC/CMP was WNL. 24h urine protein was 245mg . MFM was consulted for assistance. She was given BMZ 5/13-14, fetal growth diagnosed FGR with EFW 9% and elevated dopplers but not reversal/absent end diastolic flow.   MFM recommended: delivery around 36 weeks for difficult to control CHTN, FGR, borderline low amniotic fluid levels. Dr. 03-05-2005 recommended outpatient management with twice weekly fetal testing and amniotic fluid checks. Plan for delivery prior to 36 weeks if BP persistently >150/100s, s/sx of severe preeclampsia or nonreassuring fetal status.   On 5/15, Laura Sutton had no symptoms of preeclampsia, BP remained mild range, and NST was reactive.   Message sent to [facility] to arrange for follow up on Tuesday 5/18 with fetal testing. Patient advised to check blood pressure at home, monitor symptoms of preeclampsia and kick counts.   Consults: MFM  Discharge Exam: Blood pressure 135/76, pulse 91, temperature 97.8 F (36.6 C), temperature source Oral, resp. rate 18, height 5\' 3"  (1.6 m), weight 82.9 kg, last menstrual  period 09/02/2019, SpO2 98 %.  Disposition: Discharge disposition: 01-Home or Self Care       Discharge Instructions    Discharge activity:   Complete by: As directed    Okay to ambulate and do light activity around the house.   Discharge diet:  No restrictions   Complete by: As directed    Fetal Kick Count:  Lie on our left side for one hour after a meal, and count the number of times your baby kicks.  If it is less than 5 times, get up, move around and drink some juice.  Repeat the test 30 minutes later.  If it is still less than 5 kicks in an hour, notify your doctor.   Complete by: As directed    No sexual activity restrictions   Complete by: As directed      Allergies as of 05/03/2020   No Known Allergies     Medication List    STOP taking these medications   metoCLOPramide 10 MG tablet Commonly known as: REGLAN     TAKE these medications   ferrous sulfate 325 (65 FE) MG tablet Take 325 mg by mouth daily with breakfast.   labetalol 200 MG tablet Commonly known as: NORMODYNE Take 200 mg by mouth 2 (two) times daily. What changed: Another medication with the same name was removed. Continue taking this medication, and follow the directions you see here.   NIFEdipine 30 MG 24 hr tablet Commonly known as: ADALAT CC Take 1 tablet (30 mg total) by mouth daily. Start taking on: May 04, 2020   prenatal multivitamin Tabs tablet Take 1 tablet by mouth daily at 12 noon.      Follow-up Information    Taam-Akelman, Sakai Wolford  K, MD. Schedule an appointment as soon as possible for a visit on 05/06/2020.   Specialty: Obstetrics and Gynecology Contact information: [address] [place] [address] [phone]           Signed: Jonelle Sidle 05/03/2020, 4:40 PM

## 2020-05-06 ENCOUNTER — Encounter (HOSPITAL_COMMUNITY): Payer: Self-pay | Admitting: *Deleted

## 2020-05-06 ENCOUNTER — Telehealth (HOSPITAL_COMMUNITY): Payer: Self-pay | Admitting: *Deleted

## 2020-05-06 ENCOUNTER — Other Ambulatory Visit: Payer: Self-pay | Admitting: Obstetrics and Gynecology

## 2020-05-06 NOTE — Telephone Encounter (Signed)
Preadmission screen  

## 2020-05-07 ENCOUNTER — Other Ambulatory Visit: Payer: Self-pay

## 2020-05-07 ENCOUNTER — Inpatient Hospital Stay (HOSPITAL_COMMUNITY): Payer: Managed Care, Other (non HMO) | Admitting: Anesthesiology

## 2020-05-07 ENCOUNTER — Inpatient Hospital Stay (HOSPITAL_COMMUNITY)
Admission: AD | Admit: 2020-05-07 | Discharge: 2020-05-10 | DRG: 807 | Disposition: A | Discharge status: Home / Self Care | Payer: Managed Care, Other (non HMO) | Attending: Obstetrics and Gynecology | Admitting: Obstetrics and Gynecology

## 2020-05-07 ENCOUNTER — Encounter (HOSPITAL_COMMUNITY): Payer: Self-pay | Admitting: Obstetrics and Gynecology

## 2020-05-07 DIAGNOSIS — O10913 Unspecified pre-existing hypertension complicating pregnancy, third trimester: Secondary | ICD-10-CM

## 2020-05-07 DIAGNOSIS — O114 Pre-existing hypertension with pre-eclampsia, complicating childbirth: Secondary | ICD-10-CM | POA: Diagnosis present

## 2020-05-07 DIAGNOSIS — O119 Pre-existing hypertension with pre-eclampsia, unspecified trimester: Secondary | ICD-10-CM

## 2020-05-07 DIAGNOSIS — O365930 Maternal care for other known or suspected poor fetal growth, third trimester, not applicable or unspecified: Secondary | ICD-10-CM | POA: Diagnosis not present

## 2020-05-07 DIAGNOSIS — Z3A35 35 weeks gestation of pregnancy: Secondary | ICD-10-CM

## 2020-05-07 DIAGNOSIS — O1002 Pre-existing essential hypertension complicating childbirth: Secondary | ICD-10-CM | POA: Diagnosis present

## 2020-05-07 DIAGNOSIS — O1413 Severe pre-eclampsia, third trimester: Secondary | ICD-10-CM

## 2020-05-07 HISTORY — DX: Severe pre-eclampsia, third trimester: O14.13

## 2020-05-07 LAB — URINALYSIS, ROUTINE W REFLEX MICROSCOPIC
Bilirubin Urine: NEGATIVE
Glucose, UA: NEGATIVE mg/dL
Hgb urine dipstick: NEGATIVE
Ketones, ur: NEGATIVE mg/dL
Nitrite: NEGATIVE
Protein, ur: NEGATIVE mg/dL
Specific Gravity, Urine: 1.015 (ref 1.005–1.030)
pH: 6 (ref 5.0–8.0)

## 2020-05-07 LAB — CBC
HCT: 39.5 % (ref 36.0–46.0)
HCT: 38.8 % (ref 36.0–46.0)
Hemoglobin: 13 g/dL (ref 12.0–15.0)
Hemoglobin: 13.1 g/dL (ref 12.0–15.0)
MCH: 30.7 pg (ref 26.0–34.0)
MCH: 31.3 pg (ref 26.0–34.0)
MCHC: 32.9 g/dL (ref 30.0–36.0)
MCHC: 33.8 g/dL (ref 30.0–36.0)
MCV: 93.2 fL (ref 80.0–100.0)
MCV: 92.6 fL (ref 80.0–100.0)
Platelets: 179 10*3/uL (ref 150–400)
Platelets: 169 10*3/uL (ref 150–400)
RBC: 4.24 MIL/uL (ref 3.87–5.11)
RBC: 4.19 MIL/uL (ref 3.87–5.11)
RDW: 14.7 % (ref 11.5–15.5)
RDW: 14.6 % (ref 11.5–15.5)
WBC: 8 10*3/uL (ref 4.0–10.5)
WBC: 9.9 10*3/uL (ref 4.0–10.5)
nRBC: 0 % (ref 0.0–0.2)
nRBC: 0 % (ref 0.0–0.2)

## 2020-05-07 LAB — COMPREHENSIVE METABOLIC PANEL
ALT: 21 U/L (ref 0–44)
AST: 23 U/L (ref 15–41)
Albumin: 3 g/dL — ABNORMAL LOW (ref 3.5–5.0)
Alkaline Phosphatase: 87 U/L (ref 38–126)
Anion gap: 10 (ref 5–15)
BUN: 8 mg/dL (ref 6–20)
CO2: 22 mmol/L (ref 22–32)
Calcium: 9.1 mg/dL (ref 8.9–10.3)
Chloride: 105 mmol/L (ref 98–111)
Creatinine, Ser: 0.55 mg/dL (ref 0.44–1.00)
GFR calc Af Amer: >60 mL/min (ref >60)
GFR calc non Af Amer: >60 mL/min (ref >60)
Glucose, Bld: 87 mg/dL (ref 70–99)
Potassium: 4.3 mmol/L (ref 3.5–5.1)
Sodium: 137 mmol/L (ref 135–145)
Total Bilirubin: 0.5 mg/dL (ref 0.3–1.2)
Total Protein: 6.4 g/dL — ABNORMAL LOW (ref 6.5–8.1)

## 2020-05-07 LAB — PROTEIN / CREATININE RATIO, URINE
Creatinine, Urine: 70.38 mg/dL
Protein Creatinine Ratio: 0.11 mg/mg{Cre} (ref 0.00–0.15)
Total Protein, Urine: 8 mg/dL

## 2020-05-07 LAB — TYPE AND SCREEN
ABO/RH(D): O POS
Antibody Screen: NEGATIVE

## 2020-05-07 LAB — RPR: RPR Ser Ql: NONREACTIVE

## 2020-05-07 MED ORDER — HYDRALAZINE HCL 20 MG/ML IJ SOLN
10.0000 mg | INTRAMUSCULAR | Status: DC | PRN
  Filled 2020-05-07: qty 1

## 2020-05-07 MED ORDER — ACETAMINOPHEN 325 MG PO TABS: 650.0000 mg | ORAL_TABLET | ORAL | Status: DC | PRN

## 2020-05-07 MED ORDER — SENNOSIDES-DOCUSATE SODIUM 8.6-50 MG PO TABS
2.0000 | ORAL_TABLET | ORAL | Status: DC
  Administered 2020-05-07 – 2020-05-10 (×3): 2 via ORAL
  Filled 2020-05-07 (×3): qty 2

## 2020-05-07 MED ORDER — SODIUM CHLORIDE (PF) 0.9 % IJ SOLN
INTRAMUSCULAR | Status: DC | PRN
  Administered 2020-05-07: 12 mL/h via EPIDURAL

## 2020-05-07 MED ORDER — LABETALOL HCL 5 MG/ML IV SOLN
40.0000 mg | INTRAVENOUS | Status: DC | PRN
  Administered 2020-05-07: 40 mg via INTRAVENOUS
  Filled 2020-05-07 (×2): qty 8

## 2020-05-07 MED ORDER — DIPHENHYDRAMINE HCL 50 MG/ML IJ SOLN: 12.5000 mg | INTRAMUSCULAR | Status: DC | PRN

## 2020-05-07 MED ORDER — DIBUCAINE (PERIANAL) 1 % EX OINT: 1.0000 "application " | TOPICAL_OINTMENT | CUTANEOUS | Status: DC | PRN

## 2020-05-07 MED ORDER — LABETALOL HCL 5 MG/ML IV SOLN
20.0000 mg | INTRAVENOUS | Status: DC | PRN
  Administered 2020-05-07 (×2): 20 mg via INTRAVENOUS
  Filled 2020-05-07 (×3): qty 4

## 2020-05-07 MED ORDER — LACTATED RINGERS IV SOLN: 500.0000 mL | Freq: Once | INTRAVENOUS | Status: DC

## 2020-05-07 MED ORDER — OXYCODONE-ACETAMINOPHEN 5-325 MG PO TABS: 2.0000 | ORAL_TABLET | ORAL | Status: DC | PRN

## 2020-05-07 MED ORDER — COCONUT OIL OIL: 1.0000 "application " | TOPICAL_OIL | Status: DC | PRN

## 2020-05-07 MED ORDER — WITCH HAZEL-GLYCERIN EX PADS: 1.0000 "application " | MEDICATED_PAD | CUTANEOUS | Status: DC | PRN

## 2020-05-07 MED ORDER — EPHEDRINE 5 MG/ML INJ: 10.0000 mg | INTRAVENOUS | Status: DC | PRN

## 2020-05-07 MED ORDER — LACTATED RINGERS IV SOLN: INTRAVENOUS | Status: DC

## 2020-05-07 MED ORDER — PRENATAL MULTIVITAMIN CH
1.0000 | ORAL_TABLET | Freq: Every day | ORAL | Status: DC
  Administered 2020-05-07 – 2020-05-10 (×4): 1 via ORAL
  Filled 2020-05-07 (×4): qty 1

## 2020-05-07 MED ORDER — FENTANYL-BUPIVACAINE-NACL 0.5-0.125-0.9 MG/250ML-% EP SOLN
12.0000 mL/h | EPIDURAL | Status: DC | PRN
  Filled 2020-05-07: qty 250

## 2020-05-07 MED ORDER — MAGNESIUM SULFATE 40 GM/1000ML IV SOLN
2.0000 g/h | INTRAVENOUS | Status: AC
  Administered 2020-05-08: 2 g/h via INTRAVENOUS
  Filled 2020-05-07 (×2): qty 1000

## 2020-05-07 MED ORDER — MISOPROSTOL 200 MCG PO TABS
ORAL_TABLET | ORAL | Status: AC
  Filled 2020-05-07: qty 4

## 2020-05-07 MED ORDER — OXYTOCIN 40 UNITS IN NORMAL SALINE INFUSION - SIMPLE MED
2.5000 [IU]/h | INTRAVENOUS | Status: DC
  Filled 2020-05-07: qty 1000

## 2020-05-07 MED ORDER — ACETAMINOPHEN 325 MG PO TABS
650.0000 mg | ORAL_TABLET | ORAL | Status: DC | PRN
  Administered 2020-05-07 – 2020-05-10 (×2): 650 mg via ORAL
  Filled 2020-05-07 (×3): qty 2

## 2020-05-07 MED ORDER — DIPHENHYDRAMINE HCL 25 MG PO CAPS: 25.0000 mg | ORAL_CAPSULE | Freq: Four times a day (QID) | ORAL | Status: DC | PRN

## 2020-05-07 MED ORDER — ONDANSETRON HCL 4 MG PO TABS: 4.0000 mg | ORAL_TABLET | ORAL | Status: DC | PRN

## 2020-05-07 MED ORDER — LABETALOL HCL 200 MG PO TABS
200.0000 mg | ORAL_TABLET | Freq: Two times a day (BID) | ORAL | Status: DC
  Administered 2020-05-07 – 2020-05-09 (×6): 200 mg via ORAL
  Filled 2020-05-07 (×6): qty 1

## 2020-05-07 MED ORDER — LABETALOL HCL 5 MG/ML IV SOLN: 20.0000 mg | INTRAVENOUS | Status: DC | PRN

## 2020-05-07 MED ORDER — BENZOCAINE-MENTHOL 20-0.5 % EX AERO: 1.0000 "application " | INHALATION_SPRAY | CUTANEOUS | Status: DC | PRN

## 2020-05-07 MED ORDER — PHENYLEPHRINE 40 MCG/ML (10ML) SYRINGE FOR IV PUSH (FOR BLOOD PRESSURE SUPPORT): 80.0000 ug | PREFILLED_SYRINGE | INTRAVENOUS | Status: DC | PRN

## 2020-05-07 MED ORDER — LIDOCAINE HCL (PF) 1 % IJ SOLN: 30.0000 mL | INTRAMUSCULAR | Status: DC | PRN

## 2020-05-07 MED ORDER — LACTATED RINGERS IV SOLN
500.0000 mL | Freq: Once | INTRAVENOUS | Status: AC
  Administered 2020-05-07: 500 mL via INTRAVENOUS

## 2020-05-07 MED ORDER — OXYCODONE-ACETAMINOPHEN 5-325 MG PO TABS: 1.0000 | ORAL_TABLET | ORAL | Status: DC | PRN

## 2020-05-07 MED ORDER — NIFEDIPINE ER OSMOTIC RELEASE 30 MG PO TB24
30.0000 mg | ORAL_TABLET | Freq: Every day | ORAL | Status: DC
  Administered 2020-05-07 – 2020-05-09 (×3): 30 mg via ORAL
  Filled 2020-05-07 (×3): qty 1

## 2020-05-07 MED ORDER — IBUPROFEN 600 MG PO TABS
600.0000 mg | ORAL_TABLET | Freq: Four times a day (QID) | ORAL | Status: DC
  Administered 2020-05-07 – 2020-05-10 (×13): 600 mg via ORAL
  Filled 2020-05-07 (×13): qty 1

## 2020-05-07 MED ORDER — ONDANSETRON HCL 4 MG/2ML IJ SOLN: 4.0000 mg | INTRAMUSCULAR | Status: DC | PRN

## 2020-05-07 MED ORDER — LABETALOL HCL 5 MG/ML IV SOLN
80.0000 mg | INTRAVENOUS | Status: DC | PRN
  Administered 2020-05-07: 40 mg via INTRAVENOUS

## 2020-05-07 MED ORDER — SOD CITRATE-CITRIC ACID 500-334 MG/5ML PO SOLN: 30.0000 mL | ORAL | Status: DC | PRN

## 2020-05-07 MED ORDER — LIDOCAINE HCL (PF) 1 % IJ SOLN
INTRAMUSCULAR | Status: DC | PRN
  Administered 2020-05-07: 5 mL via EPIDURAL

## 2020-05-07 MED ORDER — LACTATED RINGERS IV SOLN: 500.0000 mL | INTRAVENOUS | Status: DC | PRN

## 2020-05-07 MED ORDER — TETANUS-DIPHTH-ACELL PERTUSSIS 5-2.5-18.5 LF-MCG/0.5 IM SUSP: 0.5000 mL | Freq: Once | INTRAMUSCULAR | Status: DC

## 2020-05-07 MED ORDER — ONDANSETRON HCL 4 MG/2ML IJ SOLN: 4.0000 mg | Freq: Four times a day (QID) | INTRAMUSCULAR | Status: DC | PRN

## 2020-05-07 MED ORDER — LABETALOL HCL 5 MG/ML IV SOLN: 40.0000 mg | INTRAVENOUS | Status: DC | PRN

## 2020-05-07 MED ORDER — HYDRALAZINE HCL 20 MG/ML IJ SOLN: 10.0000 mg | INTRAMUSCULAR | Status: DC | PRN

## 2020-05-07 MED ORDER — ZOLPIDEM TARTRATE 5 MG PO TABS: 5.0000 mg | ORAL_TABLET | Freq: Every evening | ORAL | Status: DC | PRN

## 2020-05-07 MED ORDER — MAGNESIUM SULFATE BOLUS VIA INFUSION
4.0000 g | Freq: Once | INTRAVENOUS | Status: AC
  Administered 2020-05-07: 4 g via INTRAVENOUS
  Filled 2020-05-07: qty 1000

## 2020-05-07 MED ORDER — OXYTOCIN BOLUS FROM INFUSION: 500.0000 mL | Freq: Once | INTRAVENOUS | Status: DC

## 2020-05-07 MED ORDER — LABETALOL HCL 5 MG/ML IV SOLN
80.0000 mg | INTRAVENOUS | Status: DC | PRN
  Filled 2020-05-07: qty 16

## 2020-05-07 MED ORDER — SIMETHICONE 80 MG PO CHEW: 80.0000 mg | CHEWABLE_TABLET | ORAL | Status: DC | PRN

## 2020-05-07 NOTE — Progress Notes (Signed)
Patient screened out for psychosocial assessment since none of the following apply: °Psychosocial stressors documented in mother or baby's chart °Gestation less than 32 weeks °Code at delivery  °Infant with anomalies °Please contact the Clinical Social Worker if specific needs arise, by MOB's request, or if MOB scores greater than 9/yes to question 10 on Edinburgh Postpartum Depression Screen. ° °Angel Boyd-Gilyard, MSW, LCSW °Clinical Social Work °[phone] °  °

## 2020-05-07 NOTE — Progress Notes (Signed)
Chaplain checked in with Pt. Page if there are any more emotional or spiritual concerns.

## 2020-05-07 NOTE — Anesthesia Postprocedure Evaluation (Signed)
Anesthesia Post Note  Patient: Laura Sutton  Procedure(s) Performed: AN AD HOC LABOR EPIDURAL     Patient location during evaluation: Mother Baby Anesthesia Type: Epidural Level of consciousness: awake Pain management: satisfactory to patient Vital Signs Assessment: post-procedure vital signs reviewed and stable Respiratory status: spontaneous breathing Cardiovascular status: stable Anesthetic complications: no    Last Vitals:  Vitals:   05/07/20 0955 05/07/20 1125  BP: (!) 142/82 136/88  Pulse: 77 74  Resp:  17  Temp:  36.6 C  SpO2:  100%    Last Pain:  Vitals:   05/07/20 1125  TempSrc: Oral  PainSc: 0-No pain   Pain Goal:                   [employer]

## 2020-05-07 NOTE — H&P (Signed)
29 y.o. [redacted]w[redacted]d  [id number] comes in c/o contraction in setting of exacerbation of CHTN with admission last week.  Otherwise has good fetal movement and no bleeding.  Past Medical History:  Diagnosis Date  . Hypertension     Past Surgical History:  Procedure Laterality Date  . NO PAST SURGERIES      OB History  Gravida Para Term Preterm AB Living  3 2   2   2   SAB TAB Ectopic Multiple Live Births          2    # Outcome Date GA Lbr Len/2nd Weight Sex Delivery Anes PTL Lv  3 Current           2 Preterm 02/03/18    M Vag-Spont        Birth Comments: PIH  1 Preterm 05/19/15    F Vag-Spont       Social History   Socioeconomic History  . Marital status: Married    Spouse name: Not on file  . Number of children: Not on file  . Years of education: Not on file  . Highest education level: Not on file  Occupational History  . Not on file  Tobacco Use  . Smoking status: Never Smoker  . Smokeless tobacco: Never Used  Substance and Sexual Activity  . Alcohol use: Never  . Drug use: Never  . Sexual activity: Not on file  Other Topics Concern  . Not on file  Social History Narrative  . Not on file   Social Determinants of Health   Financial Resource Strain:   . Difficulty of Paying Living Expenses:   Food Insecurity:   . Worried About Charity fundraiser in the Last Year:   . Arboriculturist in the Last Year:   Transportation Needs:   . Film/video editor (Medical):   Marland Kitchen Lack of Transportation (Non-Medical):   Physical Activity:   . Days of Exercise per Week:   . Minutes of Exercise per Session:   Stress:   . Feeling of Stress :   Social Connections:   . Frequency of Communication with Friends and Family:   . Frequency of Social Gatherings with Friends and Family:   . Attends Religious Services:   . Active Member of Clubs or Organizations:   . Attends Archivist Meetings:   Marland Kitchen Marital Status:   Intimate Partner Violence:   . Fear of Current or Ex-Partner:    . Emotionally Abused:   Marland Kitchen Physically Abused:   . Sexually Abused:    Patient has no known allergies.    Prenatal Transfer Tool  Maternal Diabetes: No Genetic Screening: Declined Maternal Ultrasounds/Referrals: Other: initial anatomy scan suspcious for abnormal heart views Fetal Ultrasounds or other Referrals:  Referred to Materal Fetal Medicine  f/u anatomy showed normal appearance of heart, fetal ECHO offered to pt and it was declined Maternal Substance Abuse:  No Significant Maternal Medications:  Meds include: Other:   labetalol 200mg  bid and Procardia XL 30mg  qd, s/p betamethasone x 2 1 week ago Significant Maternal Lab Results: Group B Strep negative  Other PNC:  h.o preterm birth x 2 (PPROM at 69.3, the other also 36.3 for NRHFT  Vitals:   05/07/20 0530 05/07/20 0600 05/07/20 0630 05/07/20 0700  BP: (!) 138/94 (!) 146/84 (!) 155/109 (!) 147/100  Pulse: 82 77 73 77  Resp:      Temp:   98.5 F (36.9 C)   TempSrc:  Oral   Weight:      Height:        Lungs/Cor:  NAD Abdomen:  soft, gravid Ex:  no cords, erythema SVE:  3/80/-3 at admission FHTs: 130, min to mod variability with 10x10s at admission, around 6am with variable/possible early decels  Toco:  q 3-5   A/P   Admitted with labor and severe range BPs  GBS Neg  S/p IV labetalol 20, 40, 80  MgSO4 started after delivery of baby  Other routine care  Philip Aspen

## 2020-05-07 NOTE — MAU Provider Note (Signed)
Chief Complaint:  [facility]   First Provider Initiated Contact with Patient 05/07/20 0155     HPI: Laura Sutton is a 29 y.o. [id number] at 68w3dwho presents to maternity admissions reporting Painful contractions for the past hour.  Also has chronic hypertension with recent exacerbation with some severe range pressures.  Was recently admitted for this, and received steroids and was started on Labetalol and Procardia (which she did take tonight). She reports good fetal movement, denies LOF, vaginal bleeding, vaginal itching/burning, urinary symptoms, h/a, dizziness, n/v, diarrhea, constipation or fever/chills.  She denies headache, visual changes or RUQ abdominal pain.  Abdominal Pain This is a new problem. The current episode started today. The problem occurs intermittently. The pain is moderate. The quality of the pain is cramping. The abdominal pain does not radiate. Pertinent negatives include no constipation, diarrhea, dysuria, fever, frequency, headaches, myalgias, nausea or vomiting. Nothing aggravates the pain. The pain is relieved by nothing. She has tried nothing for the symptoms.  Hypertension This is a recurrent problem. The problem has been gradually worsening since onset. Pertinent negatives include no anxiety, blurred vision, headaches, malaise/fatigue, peripheral edema or shortness of breath. There are no associated agents to hypertension. Treatments tried: Labetalol and Procardia. The current treatment provides no improvement. There are no compliance problems.      Past Medical History: Past Medical History:  Diagnosis Date  . Hypertension     Past obstetric history: OB History  Gravida Para Term Preterm AB Living  3 2   2   2   SAB TAB Ectopic Multiple Live Births          2    # Outcome Date GA Lbr Len/2nd Weight Sex Delivery Anes PTL Lv  3 Current           2 Preterm 02/03/18    M Vag-Spont        Birth Comments: PIH  1 Preterm 05/19/15    F  Vag-Spont       Past Surgical History: Past Surgical History:  Procedure Laterality Date  . NO PAST SURGERIES      Family History: History reviewed. No pertinent family history.  Social History: Social History   Tobacco Use  . Smoking status: Never Smoker  . Smokeless tobacco: Never Used  Substance Use Topics  . Alcohol use: Never  . Drug use: Never    Allergies: No Known Allergies  Meds:  Medications Prior to Admission  Medication Sig Dispense Refill Last Dose  . ferrous sulfate 325 (65 FE) MG tablet Take 325 mg by mouth daily with breakfast.   05/06/2020 at Unknown time  . labetalol (NORMODYNE) 200 MG tablet Take 200 mg by mouth 2 (two) times daily.   05/06/2020 at 2200  . NIFEdipine (ADALAT CC) 30 MG 24 hr tablet Take 1 tablet (30 mg total) by mouth daily. 30 tablet 1 05/06/2020 at 2100  . Prenatal Vit-Fe Fumarate-FA (PRENATAL MULTIVITAMIN) TABS tablet Take 1 tablet by mouth daily at 12 noon.   05/06/2020 at Unknown time    I have reviewed patient's Past Medical Hx, Surgical Hx, Family Hx, Social Hx, medications and allergies.   ROS:  Review of Systems  Constitutional: Negative for fever and malaise/fatigue.  Eyes: Negative for blurred vision.  Respiratory: Negative for shortness of breath.   Gastrointestinal: Positive for abdominal pain. Negative for constipation, diarrhea, nausea and vomiting.  Genitourinary: Negative for dysuria and frequency.  Musculoskeletal: Negative for myalgias.  Neurological: Negative for headaches.  Other systems negative  Physical Exam   Patient Vitals for the past 24 hrs:  BP Temp Temp src Pulse Resp Height Weight  05/07/20 0143 (!) 161/109 98.9 F (37.2 C) Oral 97 20 5\' 3"  (1.6 m) 80.6 kg   Constitutional: Well-developed, well-nourished female in no acute distress.  Cardiovascular: normal rate and rhythm Respiratory: normal effort, clear to auscultation bilaterally GI: Abd soft, non-tender, gravid appropriate for gestational age.    No rebound or guarding. MS: Extremities nontender, no edema, normal ROM Neurologic: Alert and oriented x 4.   DTRs 2+3+ with no clonus GU: Neg CVAT.  PELVIC EXAM: Dilation: 3 Effacement (%): 80 Station: -3 Presentation: Vertex Exam by:: Zakhi Dupre, CNM   FHT:  Baseline 130 , moderate variability, accelerations present, no decelerations Contractions:  Irregular     Labs: Preeclampsia labs pending --/--/O POS, O POS Performed at [facility], [address] [place], [place]  [phone])  Imaging:  See (95/09 done earlier this week  MAU Course/MDM: I have ordered labs for preeclampsia evaluation. NST reviewed, reassuring Notified Dr [place] with presentation, exam findings and test results.  Prior consult with MFM recommended delivery at 36 weeks but if pt developed severe range BPs or FHR concerns, they recommend delivery earlier Has had Betamethasone and a negaive covid test  Assessment: Single IUP at [redacted]w[redacted]d Chronic hypertension with severe range blood pressures FGR  Plan: Admit to Labor and Delivery Routine orders MD to follow  [redacted]w[redacted]d CNM, MSN Certified Nurse-Midwife 05/07/2020 1:56 AM

## 2020-05-07 NOTE — Anesthesia Preprocedure Evaluation (Signed)
Anesthesia Evaluation  Patient identified by MRN, date of birth, ID band Patient awake    Reviewed: Allergy & Precautions, H&P , NPO status , Patient's Chart, lab work & pertinent test results  Airway Mallampati: II   Neck ROM: full    Dental   Pulmonary neg pulmonary ROS,    breath sounds clear to auscultation       Cardiovascular hypertension,  Rhythm:regular Rate:Normal     Neuro/Psych    GI/Hepatic   Endo/Other    Renal/GU      Musculoskeletal   Abdominal   Peds  Hematology   Anesthesia Other Findings   Reproductive/Obstetrics (+) Pregnancy                             Anesthesia Physical Anesthesia Plan  ASA: II  Anesthesia Plan: Epidural   Post-op Pain Management:    Induction: Intravenous  PONV Risk Score and Plan: 2 and Treatment may vary due to age or medical condition  Airway Management Planned: Natural Airway  Additional Equipment:   Intra-op Plan:   Post-operative Plan:   Informed Consent: I have reviewed the patients History and Physical, chart, labs and discussed the procedure including the risks, benefits and alternatives for the proposed anesthesia with the patient or authorized representative who has indicated his/her understanding and acceptance.     Plan Discussed with: Anesthesiologist  Anesthesia Plan Comments:         Anesthesia Quick Evaluation  

## 2020-05-07 NOTE — MAU Note (Signed)
PT SAYS UC - WENT TO  DR TODAY - AT 2 PM- FELT SHARP PAINS.  BUT NOW FEEL UC'S .  LAST SEX- NONE .

## 2020-05-07 NOTE — Lactation Note (Addendum)
This note was copied from a baby's chart. Lactation Consultation Note  Patient Name: Laura Sutton [id number] Date: 05/07/2020    [name]  just came back from NICU. Late preterm and less than 6 pounds. Now 12 hours old.   Mom has initiated pumping.  Mom does not have a pump for home use but plans to contact her private insurance company.  Mom reports she does not plan to breast feed bur does plan to pump and offer her milk in bottles.   Urged STS and pumping 10 times day for 15 minutes to establish good supply for later.  Urged mom to add masssage and hand expression to pumping. Urged to watch Laura Sutton on [employer].Dad able to find it and pull it up on his phone.   Reviewed pumping frequency and storage of breastmilk.  Reviewed Understanding Mother and Baby.  Did not give LPTI sheet at this time.  LC unaware infant recently transferred from NICU to moms room.  Praised moms decision to pump and offer breastmilk in bottles.  Urged to call lactation as needed.   Maternal Data    Feeding Feeding Type: Formula Nipple Type: Nfant Slow Flow (purple)  LATCH Score                   Interventions    Lactation Tools Discussed/Used     Consult Status      Laura Sutton 05/07/2020, 10:58 PM

## 2020-05-07 NOTE — Anesthesia Procedure Notes (Signed)
Epidural Patient location during procedure: OB Start time: 05/07/2020 4:01 AM End time: 05/07/2020 4:12 AM  Staffing Anesthesiologist: Achille Rich, MD Performed: anesthesiologist   Preanesthetic Checklist Completed: patient identified, IV checked, site marked, risks and benefits discussed, monitors and equipment checked, pre-op evaluation and timeout performed  Epidural Patient position: sitting Prep: DuraPrep Patient monitoring: heart rate, cardiac monitor, continuous pulse ox and blood pressure Approach: midline Location: L2-L3 Injection technique: LOR saline  Needle:  Needle type: Tuohy  Needle gauge: 17 G Needle length: 9 cm Needle insertion depth: 7 cm Catheter type: closed end flexible Catheter size: 19 Gauge Catheter at skin depth: 12 cm Test dose: negative and Other  Assessment Events: blood not aspirated, injection not painful, no injection resistance and negative IV test  Additional Notes Informed consent obtained prior to proceeding including risk of failure, 1% risk of PDPH, risk of minor discomfort and bruising.  Discussed rare but serious complications including epidural abscess, permanent nerve injury, epidural hematoma.  Discussed alternatives to epidural analgesia and patient desires to proceed.  Timeout performed pre-procedure verifying patient name, procedure, and platelet count.  Patient tolerated procedure well. Reason for block:procedure for pain

## 2020-05-08 LAB — COMPREHENSIVE METABOLIC PANEL
ALT: 24 U/L (ref 0–44)
AST: 26 U/L (ref 15–41)
Albumin: 2.7 g/dL — ABNORMAL LOW (ref 3.5–5.0)
Alkaline Phosphatase: 68 U/L (ref 38–126)
Anion gap: 12 (ref 5–15)
BUN: 7 mg/dL (ref 6–20)
CO2: 22 mmol/L (ref 22–32)
Calcium: 6.6 mg/dL — ABNORMAL LOW (ref 8.9–10.3)
Chloride: 103 mmol/L (ref 98–111)
Creatinine, Ser: 0.57 mg/dL (ref 0.44–1.00)
GFR calc Af Amer: >60 mL/min (ref >60)
GFR calc non Af Amer: >60 mL/min (ref >60)
Glucose, Bld: 95 mg/dL (ref 70–99)
Potassium: 4.2 mmol/L (ref 3.5–5.1)
Sodium: 137 mmol/L (ref 135–145)
Total Bilirubin: 0.4 mg/dL (ref 0.3–1.2)
Total Protein: 5.8 g/dL — ABNORMAL LOW (ref 6.5–8.1)

## 2020-05-08 LAB — CBC
HCT: 36.3 % (ref 36.0–46.0)
Hemoglobin: 12 g/dL (ref 12.0–15.0)
MCH: 30.8 pg (ref 26.0–34.0)
MCHC: 33.1 g/dL (ref 30.0–36.0)
MCV: 93.1 fL (ref 80.0–100.0)
Platelets: 158 10*3/uL (ref 150–400)
RBC: 3.9 MIL/uL (ref 3.87–5.11)
RDW: 14.8 % (ref 11.5–15.5)
WBC: 9.9 10*3/uL (ref 4.0–10.5)
nRBC: 0 % (ref 0.0–0.2)

## 2020-05-08 NOTE — Lactation Note (Signed)
This note was copied from a baby's chart. Lactation Consultation Note  Patient Name: Laura Sutton [id number] Date: 05/08/2020 Reason for consult: Follow-up assessment;1st time breastfeeding;Infant < 6lbs;Late-preterm 34-36.6wks  [id number] - [id number] - I followed up with Ms. Wojahn. She smiled and stated that she meant to pump overnight but slept through it. She is interested in providing breast milk to her son, Laura Sutton.  I first showed her how to hand express. She was able to see colostrum after a few attempts, and I encouraged her to feed this to Laura Sutton (which she did). She stated "You gave me some hope" as she was not initially sure if she was producing milk.  I spent time educating on colostrum and mature milk and what to expect in the coming days. I encouraged her to pump 8-10 times a day and to feed any EBM back to baby by finger or spoon. We reviewed her pump settings and flange size.  I discussed LPTI behaviors and the benefits of breast milk. She has not breast fed before, but she would like Laura Sutton to have breast milk. Lots of education and encouragement provided.  Parents are calling insurance today to obtain their pump. I made them aware of the pump rental option at the gift shop.   Maternal Data Has patient been taught Hand Expression?: Yes Does the patient have breastfeeding experience prior to this delivery?: No   Interventions Interventions: Hand express;Breast feeding basics reviewed;DEBP  Lactation Tools Discussed/Used Tools: Pump;Other (comment)(spoon) Breast pump type: Double-Electric Breast Pump Pump Review: Setup, frequency, and cleaning   Consult Status Consult Status: Follow-up Date: 05/09/20 Follow-up type: In-patient    Laura Sutton 05/08/2020, 10:16 AM

## 2020-05-08 NOTE — Progress Notes (Addendum)
Patient is eating, ambulating, voiding.  Pain control is good.  Vitals:   05/08/20 0530 05/08/20 0600 05/08/20 0630 05/08/20 0735  BP:    126/86  Pulse:    81  Resp: 17 18 18 18   Temp:    98.6 F (37 C)  TempSrc:    Oral  SpO2:    100%  Weight:      Height:        Fundus firm Perineum without swelling.  Lab Results  Component Value Date   WBC 9.9 05/08/2020   HGB 12.0 05/08/2020   HCT 36.3 05/08/2020   MCV 93.1 05/08/2020   PLT 158 05/08/2020    --/--/O POS (05/19 0229)/RI  A/P Post partum day 1 severe preeclampsia and preterm labor.   Routine care.  Expect d/c tomorrow.  Baby back to [facility] and doing well. Now off magnesium sulfate after 24 hours pp.  BP stable right now.    [facility]

## 2020-05-09 ENCOUNTER — Encounter (HOSPITAL_COMMUNITY): Payer: Self-pay | Admitting: Obstetrics and Gynecology

## 2020-05-09 LAB — SURGICAL PATHOLOGY

## 2020-05-09 MED ORDER — NIFEDIPINE ER OSMOTIC RELEASE 30 MG PO TB24
60.0000 mg | ORAL_TABLET | Freq: Every day | ORAL | Status: DC
  Administered 2020-05-10: 60 mg via ORAL
  Filled 2020-05-09: qty 2

## 2020-05-09 MED ORDER — NIFEDIPINE ER OSMOTIC RELEASE 30 MG PO TB24
30.0000 mg | ORAL_TABLET | Freq: Once | ORAL | Status: AC
  Administered 2020-05-09: 30 mg via ORAL
  Filled 2020-05-09: qty 1

## 2020-05-09 NOTE — Lactation Note (Signed)
This note was copied from a baby's chart. Lactation Consultation Note  Patient Name: Laura Sutton [id number]'B Date: 05/09/2020 Reason for consult: Follow-up assessment;1st time breastfeeding;Infant < 6lbs;Late-preterm 34-36.6wks  LC in to visit with P3 Mom of LPTI at 26 hrs old.  Baby's birth weight was 4 lbs 3.7 oz and is down 3.1% today.  Bilirubin levels WNL.  Mom has been setting her phone alarm to awaken and remind her to pump.  But baby is needing to be fed every 3 hrs, so she is pumping after bottle feeding baby.  Mom able to express drops of colostrum by hand and put on baby's lips.    Baby is now >48 hrs old and encouraged Mom to increase volume to 20-30 ml at each feeding.  Mom pleased that baby took 20 ml at 8 am feeding.  Baby feeding using gold nipple.  Encouraged STS with baby on Mom's chest as much as possible, placing blanket over baby.    Mom ordered her pump from insurance.  Knows about gift shop rental program.  Mom aware of gift shop open M-F.    Mom knows to call prn for assistance.   Interventions Interventions: Breast feeding basics reviewed;Skin to skin;Breast massage;Hand express;DEBP  Lactation Tools Discussed/Used Tools: Pump;Bottle Breast pump type: Double-Electric Breast Pump   Consult Status Consult Status: Follow-up Date: 05/10/20 Follow-up type: In-patient    Judee Clara 05/09/2020, 8:35 AM

## 2020-05-09 NOTE — Progress Notes (Signed)
Patient is doing well.  She is ambulating, voiding, tolerating PO.  Pain control is good.  Lochia is appropriate Mild headache.  BPs were great yesterday on home regimen, mild elevation this AM (has not taken AM labetalol or procardia XL)  Vitals:   05/08/20 1920 05/08/20 2345 05/09/20 0444 05/09/20 0840  BP: 137/74 123/73 118/70 (!) 151/97  Pulse: 90 85 77 76  Resp: 18 16 16    Temp: 98 F (36.7 C) 98.5 F (36.9 C) 97.9 F (36.6 C) 98.2 F (36.8 C)  TempSrc: Oral Oral Oral Oral  SpO2: 100% 100% 99%   Weight:      Height:        NAD Fundus firm Ext: No edema  Lab Results  Component Value Date   WBC 9.9 05/08/2020   HGB 12.0 05/08/2020   HCT 36.3 05/08/2020   MCV 93.1 05/08/2020   PLT 158 05/08/2020    --/--/O POS (05/19 0229)/RImmune  A/P 29 y.o. 04-18-1975 PPD#2 s/p SVD at 35 weeks c/b PTL and superimposed severe preeclampsia CHTN / SI preE:  S/p 24 hr PP MgSO4.  On labetalol 200mg  BID and procardia XL 30mg  daily.  BPs normal yesterday.  Elevated x 1 this AM.  Will monitor VS q2h today.  IF consistently elevatated, will adjust anti-hypertensives as needed. Will monitor in house today  [facility] GEFFEL [place]

## 2020-05-09 NOTE — Progress Notes (Signed)
Vitals:   05/09/20 0444 05/09/20 0840 05/09/20 1054 05/09/20 1304  BP: 118/70 (!) 151/97 134/80 (!) 141/94  Pulse: 77 76 88 84  Resp: 16   18  Temp: 97.9 F (36.6 C) 98.2 F (36.8 C)  98.2 F (36.8 C)  TempSrc: Oral Oral  Oral  SpO2: 99%   99%  Weight:      Height:        Will add one time dose of procardia XL 30mg  now and increase daily to 60mg .

## 2020-05-10 MED ORDER — LABETALOL HCL 200 MG PO TABS
400.0000 mg | ORAL_TABLET | Freq: Three times a day (TID) | ORAL | Status: DC
  Administered 2020-05-10 (×2): 400 mg via ORAL
  Filled 2020-05-10 (×2): qty 2

## 2020-05-10 MED ORDER — IBUPROFEN 600 MG PO TABS: 600.0000 mg | ORAL_TABLET | Freq: Four times a day (QID) | ORAL | 0 refills | Status: DC

## 2020-05-10 MED ORDER — NIFEDIPINE ER 60 MG PO TB24: 60.0000 mg | ORAL_TABLET | Freq: Every day | ORAL | 3 refills | Status: DC

## 2020-05-10 MED ORDER — LABETALOL HCL 200 MG PO TABS: 400.0000 mg | ORAL_TABLET | Freq: Three times a day (TID) | ORAL | 3 refills | Status: DC

## 2020-05-10 NOTE — Progress Notes (Signed)
Pt given discharge instructions. All questions answered. IV discontinued.  

## 2020-05-10 NOTE — Progress Notes (Addendum)
Patient is doing well.  She is ambulating, voiding, tolerating PO.  Pain control is good.  Lochia is appropriate BPs labile yesterday--procardia XL was increased to 60mg  daily.  Some initial improvement, but most recent BPs again elevated   Vitals:   05/09/20 2201 05/10/20 0006 05/10/20 0559 05/10/20 0743  BP: (!) 145/86 128/79 (!) 141/100 134/84  Pulse: 89 85 91 73  Resp:  18 17 18   Temp:  98.2 F (36.8 C) 97.8 F (36.6 C) 98.7 F (37.1 C)  TempSrc:  Oral Oral Oral  SpO2:  100% 98% 99%  Weight:      Height:        NAD Fundus firm Ext: No edema  Lab Results  Component Value Date   WBC 9.9 05/08/2020   HGB 12.0 05/08/2020   HCT 36.3 05/08/2020   MCV 93.1 05/08/2020   PLT 158 05/08/2020    --/--/O POS (05/19 0229)/RImmune  A/P 29 y.o. 05/10/2020 PPD#3 s/p SVD at 35 weeks c/b PTL and superimposed severe preeclampsia CHTN / SI preE:  S/p 24 hr PP MgSO4.  Will continue procardia Xl 60mg  daily, increase labetalol to 400mg  TID and monitor closely today.  Feeling well.  Baby is being discharged today.  She strongly desires discharge to home.  Will monitor BPs closely this morning.  If improvement seen, will consider discharge with close interval f/u with me in office next week.  Patient already has bp cuff at home and comfortable monitoring bps at home.   [facility] GEFFEL [id number]

## 2020-05-10 NOTE — Discharge Summary (Signed)
Postpartum Discharge Summary     Patient Name: Laura Sutton DOB: 1991-11-22 MRN: [id number]  Date of admission: 05/07/2020 Delivery date:05/07/2020  Delivering provider: Allyn Kenner  Date of discharge: 05/10/2020  Admitting diagnosis: Severe preeclampsia, third trimester [O14.13] Intrauterine pregnancy: [redacted]w[redacted]d     Secondary diagnosis:  Active Problems:   Severe preeclampsia, third trimester  Additional problems: Preterm labor   Discharge diagnosis: CHTN with superimposed preeclampsia and Preterm labor                                              Post partum procedures:n/a Complications: None  Hospital course: Onset of Labor With Vaginal Delivery      29 y.o. yo [phone] at [redacted]w[redacted]d was admitted with preterm labor and superimposed preeclampsia. on 05/07/2020. Patient had an uncomplicated labor course as follows:  Membrane Rupture Time/Date: 7:50 AM ,05/07/2020   Delivery Method:Vaginal, Spontaneous  Episiotomy: None  Lacerations:  None  She received 24hr of magnesium sulfate postpartum.  Her home BP meds were adjusted to labetalol 400mg  po tid and procardia XL 60mg  daily for improved BP control.  She desired discharge to home on ppd#3.  She is ambulating, tolerating a regular diet, passing flatus, and urinating well. Patient is discharged home in stable condition on 05/10/20.  Newborn Data: Birth date:05/07/2020  Birth time:7:52 AM  Gender:Female  Living status:Living  Apgars: ,  Weight:1920 g   Magnesium Sulfate received: Yes: Seizure prophylaxis  Physical exam  Vitals:   05/10/20 0743 05/10/20 1005 05/10/20 1158 05/10/20 1400  BP: 134/84 (!) 136/91 (!) 146/92 134/87  Pulse: 73 88 77 92  Resp: 18 18 18    Temp: 98.7 F (37.1 C)  98.3 F (36.8 C)   TempSrc: Oral  Oral   SpO2: 99% 98% 99%   Weight:      Height:       General: alert, cooperative and no distress Lochia: appropriate Uterine Fundus: firm  DVT Evaluation: No evidence of DVT seen on physical  exam. Negative Homan's sign. Labs: Lab Results  Component Value Date   WBC 9.9 05/08/2020   HGB 12.0 05/08/2020   HCT 36.3 05/08/2020   MCV 93.1 05/08/2020   PLT 158 05/08/2020   CMP Latest Ref Rng & Units 05/08/2020  Glucose 70 - 99 mg/dL 95  BUN 6 - 20 mg/dL 7  Creatinine 0.44 - 1.00 mg/dL 0.57  Sodium 135 - 145 mmol/L 137  Potassium 3.5 - 5.1 mmol/L 4.2  Chloride 98 - 111 mmol/L 103  CO2 22 - 32 mmol/L 22  Calcium 8.9 - 10.3 mg/dL 6.6(L)  Total Protein 6.5 - 8.1 g/dL 5.8(L)  Total Bilirubin 0.3 - 1.2 mg/dL 0.4  Alkaline Phos 38 - 126 U/L 68  AST 15 - 41 U/L 26  ALT 0 - 44 U/L 24   Edinburgh Score: Edinburgh Postnatal Depression Scale Screening Tool 05/08/2020  I have been able to laugh and see the funny side of things. 0  I have looked forward with enjoyment to things. 0  I have blamed myself unnecessarily when things went wrong. 0  I have been anxious or worried for no good reason. 0  I have felt scared or panicky for no good reason. 0  Things have been getting on top of me. 0  I have been so unhappy that I have had difficulty sleeping. 0  I have felt sad or miserable. 0  I have been so unhappy that I have been crying. 0  The thought of harming myself has occurred to me. 0  Edinburgh Postnatal Depression Scale Total 0      After visit meds:  Allergies as of 05/10/2020   No Known Allergies     Medication List    STOP taking these medications   ferrous sulfate 325 (65 FE) MG tablet     TAKE these medications   acetaminophen 500 MG tablet Commonly known as: TYLENOL Take 500 mg by mouth every 6 (six) hours as needed for mild pain or headache.   ibuprofen 600 MG tablet Commonly known as: ADVIL Take 1 tablet (600 mg total) by mouth every 6 (six) hours.   labetalol 200 MG tablet Commonly known as: NORMODYNE Take 2 tablets (400 mg total) by mouth every 8 (eight) hours. What changed:   how much to take  when to take this   NIFEdipine 60 MG 24 hr  tablet Commonly known as: ADALAT CC Take 1 tablet (60 mg total) by mouth daily. Start taking on: May 11, 2020 What changed:   medication strength  how much to take   prenatal multivitamin Tabs tablet Take 1 tablet by mouth daily at 12 noon.        Discharge home in stable condition Infant Disposition:home with mother Discharge instruction: per After Visit Summary and Postpartum booklet. Activity: Advance as tolerated. Pelvic rest for 6 weeks.  Diet: routine diet  Postpartum Appointment:2-3 days Additional Postpartum F/U: BP check 2-3 days Future Appointments:No future appointments. Follow up Visit: Follow-up Information    Marlow Baars, MD Follow up in 3 day(s).   Specialty: Obstetrics Contact information: [address] [place] [address] [phone]               05/10/2020 [facility] Lizabeth Leyden, MD

## 2020-05-10 NOTE — Progress Notes (Addendum)
BPs have been improved since adjusting labetalol dose--now running 130-140/80-90s, which has been her baseline [facility]) throughout the pregnancy.  She is feeling well.  She has no facial or lower extremity edema.  She desires d/c to home.  Will check BPs at least BID and f/u in office in 3d for BP check, sooner if consistently elevated.  Preeclampsia precautions reviewed

## 2020-05-10 NOTE — Lactation Note (Signed)
This note was copied from a baby's chart. Lactation Consultation Note  Patient Name: Boy Tequita Marrs [id number] Date: 05/10/2020 Reason for consult: Follow-up assessment;Late-preterm 34-36.6wks;Infant < 6lbs   P3, Baby 72 hours old and weight stable.   Mother has been pumping q 3 hours with volume of approx 5 ml. Discussed options of giving breastmilk with spoon or slow flow nipple prior to formula. Mother is using purple nipple to feed.  Observed bottle feeding and reminded mother to pace feed. Volume goal now 30 ml +.   Mother is to feed with cues at least q 3 hours. Discussed pumping schedule options for a minimum of 8 times per day. Mother received an email that her DEBP Motif pump should be at her home when she arrives. Referred mother to milk storage guidelines and to brochure to consider oupatient appointment as infant matures to breastfeed and for questions.   Discussed engorgement care.      Maternal Data Has patient been taught Hand Expression?: Yes  Feeding Feeding Type: Bottle Fed - Formula Nipple Type: Nfant Slow Flow (purple)  LATCH Score                   Interventions Interventions: DEBP  Lactation Tools Discussed/Used     Consult Status Consult Status: Complete Date: 05/10/20    Dahlia Byes [facility] 05/10/2020, 8:27 AM

## 2020-05-10 NOTE — Progress Notes (Signed)
Pt and her baby discharged in stable condition.

## 2020-05-12 ENCOUNTER — Other Ambulatory Visit (HOSPITAL_COMMUNITY): Payer: Managed Care, Other (non HMO)

## 2020-05-14 ENCOUNTER — Inpatient Hospital Stay (HOSPITAL_COMMUNITY): Payer: Managed Care, Other (non HMO)

## 2020-05-14 ENCOUNTER — Inpatient Hospital Stay (HOSPITAL_COMMUNITY)
Admission: AD | Admit: 2020-05-14 | Payer: Managed Care, Other (non HMO) | Source: Home / Self Care | Admitting: Obstetrics and Gynecology

## 2020-07-07 ENCOUNTER — Other Ambulatory Visit: Payer: Self-pay

## 2020-07-07 ENCOUNTER — Encounter (HOSPITAL_BASED_OUTPATIENT_CLINIC_OR_DEPARTMENT_OTHER): Payer: Self-pay | Admitting: Obstetrics and Gynecology

## 2020-07-07 NOTE — Progress Notes (Signed)
Spoke w/ via phone for pre-op interview--- PT Lab needs dos--  Istat, Urine preg, EKG (pre-op orders pending)            Lab results------ no COVID test ------ 07-11-2020 @ 0830 Arrive at ------- 0530 NPO after MN  Medications to take morning of surgery ----- Labetalol w/ sips of water Diabetic medication ----- n/a Patient Special Instructions ----- n/a Pre-Op special Istructions ----- pre-op orders pending, sent inbox message in epic dr Claiborne Billings Patient verbalized understanding of instructions that were given at this phone interview. Patient denies shortness of breath, chest pain, fever, cough a this phone interview.

## 2020-07-11 ENCOUNTER — Other Ambulatory Visit (HOSPITAL_COMMUNITY)
Admission: RE | Admit: 2020-07-11 | Discharge: 2020-07-11 | Disposition: A | Discharge status: Home / Self Care | Payer: Managed Care, Other (non HMO) | Source: Ambulatory Visit | Attending: Obstetrics and Gynecology | Admitting: Obstetrics and Gynecology

## 2020-07-11 DIAGNOSIS — Z01812 Encounter for preprocedural laboratory examination: Secondary | ICD-10-CM | POA: Insufficient documentation

## 2020-07-11 DIAGNOSIS — Z20822 Contact with and (suspected) exposure to covid-19: Secondary | ICD-10-CM | POA: Diagnosis not present

## 2020-07-11 LAB — SARS CORONAVIRUS 2 (TAT 6-24 HRS): SARS Coronavirus 2: NEGATIVE

## 2020-07-14 NOTE — H&P (Addendum)
29 y.o. [id number] presents for CKC of cervix for abnormal biopsy.  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  Past Medical History:  Diagnosis Date  . Cervical dysplasia   . Pregnancy induced hypertension    followed by dr Claiborne Billings (gyn)---- SVD 05-07-2020  . Wears contact lenses    Past Surgical History:  Procedure Laterality Date  . NO PAST SURGERIES      Social History   Socioeconomic History  . Marital status: Married    Spouse name: Not on file  . Number of children: Not on file  . Years of education: Not on file  . Highest education level: Not on file  Occupational History  . Not on file  Tobacco Use  . Smoking status: Never Smoker  . Smokeless tobacco: Never Used  Vaping Use  . Vaping Use: Never used  Substance and Sexual Activity  . Alcohol use: Never  . Drug use: Never  . Sexual activity: Not on file  Other Topics Concern  . Not on file  Social History Narrative  . Not on file   Social Determinants of Health   Financial Resource Strain:   . Difficulty of Paying Living Expenses:   Food Insecurity:   . Worried About Programme researcher, broadcasting/film/video in the Last Year:   . Barista in the Last Year:   Transportation Needs:   . Freight forwarder (Medical):   Marland Kitchen Lack of Transportation (Non-Medical):   Physical Activity:   . Days of Exercise per Week:   . Minutes of Exercise per Session:   Stress:   . Feeling of Stress :   Social Connections:   . Frequency of Communication with Friends and Family:   . Frequency of Social Gatherings with Friends and Family:   . Attends Religious Services:   . Active Member of Clubs or Organizations:   . Attends Banker Meetings:   Marland Kitchen Marital Status:   Intimate Partner Violence:   . Fear of Current or Ex-Partner:   . Emotionally Abused:   Marland Kitchen  Physically Abused:   . Sexually Abused:     No current facility-administered medications on file prior to encounter.   Current Outpatient Medications on File Prior to Encounter  Medication Sig Dispense Refill  . acetaminophen (TYLENOL) 500 MG tablet Take 500 mg by mouth every 6 (six) hours as needed for mild pain or headache.    . labetalol (NORMODYNE) 200 MG tablet Take 2 tablets (400 mg total) by mouth every 8 (eight) hours. (Patient taking differently: Take 200 mg by mouth 2 (two) times daily. ) 90 tablet 3    No Known Allergies  Vitals:   07/07/20 1746  Weight: 72.6 kg  Height: 5\' 3"  (1.6 m)   In office exam: Lungs: clear to ascultation Cor:  RRR Abdomen:  soft, nontender, nondistended. Ex:  no cords, erythema Pelvic:  Deferred to OR  A:  CIN3/AIS   P: Advised pt I recommend cold knife cone. Pt no longer desiring childbearing. Discussed risks of cervical insufficiency if did have subsequent pregnancy. Discussed if invasive cancer found on CKC specimen would need to f/u with gyn onc for further treatment. Discussed risks/benefits/expecations and need for ongoing lifelong surveillance. Pt would like to proceed with CKC, illustration of procedure shown to pt. All risks, benefits and alternatives d/w  patient and she desires to proceed.   Philip Aspen  There has been no change in the patients history, status or exam since the history and physical.  Vitals:   07/07/20 1746 07/15/20 0547  BP:  (!) 139/106  Pulse:  82  Resp:  18  Temp:  98.4 F (36.9 C)  TempSrc:  Oral  SpO2:  100%  Weight: 72.6 kg 74.8 kg  Height: 5\' 3"  (1.6 m) 5\' 3"  (1.6 m)    Results for orders placed or performed during the hospital encounter of 07/15/20 (from the past 72 hour(s))  Type and screen [facility]     Status: None (Preliminary result)   Collection Time: 07/15/20  5:39 AM  Result Value Ref Range   ABO/RH(D) PENDING    Antibody Screen PENDING    Sample Expiration       07/18/2020,2359 Performed at [facility], [address], [place], [place]   Pregnancy, urine POC     Status: None   Collection Time: 07/15/20  5:50 AM  Result Value Ref Range   Preg Test, Ur NEGATIVE NEGATIVE    Comment:        THE SENSITIVITY OF THIS METHODOLOGY IS >24 mIU/mL    No changes to H&P or patient status. Final review and discussion of procedure completed, consent obtained after discussion of risks/benefits/alternatives/complications. [id number]

## 2020-07-14 NOTE — Anesthesia Preprocedure Evaluation (Addendum)
Anesthesia Evaluation  Patient identified by MRN, date of birth, ID band Patient awake    Reviewed: Allergy & Precautions, NPO status , Patient's Chart, lab work & pertinent test results  History of Anesthesia Complications Negative for: history of anesthetic complications  Airway Mallampati: II  TM Distance: >3 FB Neck ROM: Full    Dental no notable dental hx. (+) Dental Advisory Given   Pulmonary neg pulmonary ROS,    Pulmonary exam normal        Cardiovascular hypertension, Pt. on home beta blockers Normal cardiovascular exam     Neuro/Psych negative neurological ROS     GI/Hepatic negative GI ROS, Neg liver ROS,   Endo/Other  negative endocrine ROS  Renal/GU negative Renal ROS     Musculoskeletal negative musculoskeletal ROS (+)   Abdominal   Peds  Hematology negative hematology ROS (+)   Anesthesia Other Findings   Reproductive/Obstetrics                            Anesthesia Physical Anesthesia Plan  ASA: II  Anesthesia Plan: General   Post-op Pain Management:    Induction: Intravenous  PONV Risk Score and Plan: 4 or greater and Ondansetron, Dexamethasone, Midazolam and Scopolamine patch - Pre-op  Airway Management Planned: LMA  Additional Equipment:   Intra-op Plan:   Post-operative Plan: Extubation in OR  Informed Consent: I have reviewed the patients History and Physical, chart, labs and discussed the procedure including the risks, benefits and alternatives for the proposed anesthesia with the patient or authorized representative who has indicated his/her understanding and acceptance.     Dental advisory given  Plan Discussed with: Anesthesiologist and CRNA  Anesthesia Plan Comments:        Anesthesia Quick Evaluation

## 2020-07-15 ENCOUNTER — Encounter (HOSPITAL_BASED_OUTPATIENT_CLINIC_OR_DEPARTMENT_OTHER): Payer: Self-pay | Admitting: Obstetrics and Gynecology

## 2020-07-15 ENCOUNTER — Ambulatory Visit (HOSPITAL_BASED_OUTPATIENT_CLINIC_OR_DEPARTMENT_OTHER)
Admission: RE | Admit: 2020-07-15 | Discharge: 2020-07-15 | Disposition: A | Discharge status: Home / Self Care | Payer: Managed Care, Other (non HMO) | Attending: Obstetrics and Gynecology | Admitting: Obstetrics and Gynecology

## 2020-07-15 ENCOUNTER — Ambulatory Visit (HOSPITAL_BASED_OUTPATIENT_CLINIC_OR_DEPARTMENT_OTHER): Payer: Managed Care, Other (non HMO) | Admitting: Anesthesiology

## 2020-07-15 ENCOUNTER — Other Ambulatory Visit: Payer: Self-pay

## 2020-07-15 ENCOUNTER — Encounter (HOSPITAL_BASED_OUTPATIENT_CLINIC_OR_DEPARTMENT_OTHER)
Admission: RE | Disposition: A | Discharge status: Home / Self Care | Payer: Self-pay | Source: Home / Self Care | Attending: Obstetrics and Gynecology

## 2020-07-15 DIAGNOSIS — I10 Essential (primary) hypertension: Secondary | ICD-10-CM | POA: Diagnosis not present

## 2020-07-15 DIAGNOSIS — D069 Carcinoma in situ of cervix, unspecified: Secondary | ICD-10-CM | POA: Diagnosis not present

## 2020-07-15 DIAGNOSIS — Z79899 Other long term (current) drug therapy: Secondary | ICD-10-CM | POA: Diagnosis not present

## 2020-07-15 DIAGNOSIS — N879 Dysplasia of cervix uteri, unspecified: Secondary | ICD-10-CM | POA: Diagnosis present

## 2020-07-15 HISTORY — DX: Presence of spectacles and contact lenses: Z97.3

## 2020-07-15 HISTORY — DX: Dysplasia of cervix uteri, unspecified: N87.9

## 2020-07-15 HISTORY — PX: CERVICAL CONIZATION W/BX: SHX1330

## 2020-07-15 HISTORY — DX: Gestational (pregnancy-induced) hypertension without significant proteinuria, unspecified trimester: O13.9

## 2020-07-15 LAB — TYPE AND SCREEN
ABO/RH(D): O POS
Antibody Screen: NEGATIVE

## 2020-07-15 LAB — POCT PREGNANCY, URINE: Preg Test, Ur: NEGATIVE

## 2020-07-15 SURGERY — CONE BIOPSY, CERVIX
Anesthesia: General | Site: Vagina

## 2020-07-15 MED ORDER — LIDOCAINE 2% (20 MG/ML) 5 ML SYRINGE
INTRAMUSCULAR | Status: AC
  Filled 2020-07-15: qty 5

## 2020-07-15 MED ORDER — FENTANYL CITRATE (PF) 100 MCG/2ML IJ SOLN
INTRAMUSCULAR | Status: DC | PRN
  Administered 2020-07-15 (×2): 50 ug via INTRAVENOUS

## 2020-07-15 MED ORDER — PROMETHAZINE HCL 25 MG/ML IJ SOLN: 6.2500 mg | INTRAMUSCULAR | Status: DC | PRN

## 2020-07-15 MED ORDER — MIDAZOLAM HCL 2 MG/2ML IJ SOLN
INTRAMUSCULAR | Status: AC
  Filled 2020-07-15: qty 2

## 2020-07-15 MED ORDER — ACETAMINOPHEN 500 MG PO TABS
1000.0000 mg | ORAL_TABLET | Freq: Once | ORAL | Status: AC
  Administered 2020-07-15: 1000 mg via ORAL

## 2020-07-15 MED ORDER — PROPOFOL 10 MG/ML IV BOLUS
INTRAVENOUS | Status: DC | PRN
  Administered 2020-07-15: 200 mg via INTRAVENOUS

## 2020-07-15 MED ORDER — FENTANYL CITRATE (PF) 100 MCG/2ML IJ SOLN
INTRAMUSCULAR | Status: AC
  Filled 2020-07-15: qty 2

## 2020-07-15 MED ORDER — FENTANYL CITRATE (PF) 100 MCG/2ML IJ SOLN
25.0000 ug | INTRAMUSCULAR | Status: DC | PRN
  Administered 2020-07-15: 50 ug via INTRAVENOUS

## 2020-07-15 MED ORDER — DEXAMETHASONE SODIUM PHOSPHATE 10 MG/ML IJ SOLN
INTRAMUSCULAR | Status: DC | PRN
  Administered 2020-07-15: 10 mg via INTRAVENOUS

## 2020-07-15 MED ORDER — ACETAMINOPHEN 500 MG PO TABS
ORAL_TABLET | ORAL | Status: AC
  Filled 2020-07-15: qty 2

## 2020-07-15 MED ORDER — VASOPRESSIN 20 UNIT/ML IV SOLN
INTRAVENOUS | Status: DC | PRN
  Administered 2020-07-15: 10 mL via INTRAMUSCULAR

## 2020-07-15 MED ORDER — PROPOFOL 500 MG/50ML IV EMUL
INTRAVENOUS | Status: AC
  Filled 2020-07-15: qty 50

## 2020-07-15 MED ORDER — ACETIC ACID 5 % SOLN
Status: DC | PRN
  Administered 2020-07-15: 1 via TOPICAL

## 2020-07-15 MED ORDER — ONDANSETRON HCL 4 MG/2ML IJ SOLN
INTRAMUSCULAR | Status: AC
  Filled 2020-07-15: qty 2

## 2020-07-15 MED ORDER — LIDOCAINE-EPINEPHRINE (PF) 1 %-1:200000 IJ SOLN
INTRAMUSCULAR | Status: DC | PRN
  Administered 2020-07-15: 10 mL

## 2020-07-15 MED ORDER — DEXAMETHASONE SODIUM PHOSPHATE 10 MG/ML IJ SOLN
INTRAMUSCULAR | Status: AC
  Filled 2020-07-15: qty 1

## 2020-07-15 MED ORDER — CELECOXIB 200 MG PO CAPS
200.0000 mg | ORAL_CAPSULE | Freq: Once | ORAL | Status: AC
  Administered 2020-07-15: 200 mg via ORAL

## 2020-07-15 MED ORDER — MIDAZOLAM HCL 5 MG/5ML IJ SOLN
INTRAMUSCULAR | Status: DC | PRN
  Administered 2020-07-15: 2 mg via INTRAVENOUS

## 2020-07-15 MED ORDER — CELECOXIB 200 MG PO CAPS
ORAL_CAPSULE | ORAL | Status: AC
  Filled 2020-07-15: qty 1

## 2020-07-15 MED ORDER — FERRIC SUBSULFATE SOLN
Status: DC | PRN
  Administered 2020-07-15: 1

## 2020-07-15 MED ORDER — ONDANSETRON HCL 4 MG/2ML IJ SOLN
INTRAMUSCULAR | Status: DC | PRN
  Administered 2020-07-15: 4 mg via INTRAVENOUS

## 2020-07-15 MED ORDER — LIDOCAINE 2% (20 MG/ML) 5 ML SYRINGE
INTRAMUSCULAR | Status: DC | PRN
  Administered 2020-07-15: 100 mg via INTRAVENOUS

## 2020-07-15 MED ORDER — LACTATED RINGERS IV SOLN: INTRAVENOUS | Status: DC

## 2020-07-15 SURGICAL SUPPLY — 29 items
APL SWBSTK 6 STRL LF DISP (MISCELLANEOUS) ×1
APPLICATOR COTTON TIP 6 STRL (MISCELLANEOUS) ×1 IMPLANT
APPLICATOR COTTON TIP 6IN STRL (MISCELLANEOUS) ×3
BLADE SURG 11 STRL SS (BLADE) ×3 IMPLANT
CATH ROBINSON RED A/P 14FR (CATHETERS) ×3 IMPLANT
COVER SURGICAL LIGHT HANDLE (MISCELLANEOUS) ×3 IMPLANT
ELECT BALL LEEP 5MM RED (ELECTRODE) ×3 IMPLANT
GAUZE SPONGE 4X4 12PLY STRL (GAUZE/BANDAGES/DRESSINGS) ×3 IMPLANT
GLOVE BIOGEL PI IND STRL 6.5 (GLOVE) ×2 IMPLANT
GLOVE BIOGEL PI IND STRL 7.0 (GLOVE) ×1 IMPLANT
GLOVE BIOGEL PI INDICATOR 6.5 (GLOVE) ×4
GLOVE BIOGEL PI INDICATOR 7.0 (GLOVE) ×2
GLOVE ECLIPSE 6.5 STRL STRAW (GLOVE) ×3 IMPLANT
GOWN STRL REUS W/ TWL LRG LVL3 (GOWN DISPOSABLE) ×2 IMPLANT
GOWN STRL REUS W/TWL LRG LVL3 (GOWN DISPOSABLE) ×6
HIBICLENS CHG 4% 4OZ (MISCELLANEOUS) IMPLANT
MANIFOLD NEPTUNE II (INSTRUMENTS) ×3 IMPLANT
NEEDLE SPNL 22GX3.5 QUINCKE BK (NEEDLE) ×3 IMPLANT
NS IRRIG 1000ML POUR BTL (IV SOLUTION) IMPLANT
NS IRRIG 500ML POUR BTL (IV SOLUTION) ×3 IMPLANT
PACK VAGINAL MINOR WOMEN LF (CUSTOM PROCEDURE TRAY) IMPLANT
PACK VAGINAL WOMENS (CUSTOM PROCEDURE TRAY) ×3 IMPLANT
PAD OB MATERNITY 4.3X12.25 (PERSONAL CARE ITEMS) ×3 IMPLANT
PENCIL SMOKE EVAC W/HOLSTER (ELECTROSURGICAL) ×3 IMPLANT
SCOPETTES 8  STERILE (MISCELLANEOUS) ×4
SCOPETTES 8 STERILE (MISCELLANEOUS) ×2 IMPLANT
SUT SILK 2 0 SH (SUTURE) ×3 IMPLANT
SUT VICRYL 0 UR6 27IN ABS (SUTURE) ×9 IMPLANT
TOWEL OR 17X26 10 PK STRL BLUE (TOWEL DISPOSABLE) ×3 IMPLANT

## 2020-07-15 NOTE — Op Note (Signed)
07/15/2020  8:24 AM  PATIENT:  Laura Sutton  29 y.o. female  PRE-OPERATIVE DIAGNOSIS:  D06.9 Cervical dysplasia  POST-OPERATIVE DIAGNOSIS:  D06.9 Cervical dysplasia  PROCEDURE:  Procedure(s): CONIZATION CERVIX WITH BIOPSY (N/A)  SURGEON:  Surgeon(s) and Role:    Rogue Bussing, Yaphet Smethurst, DO - Primary  ANESTHESIA:   MAC  EBL:  5 mL   BLOOD ADMINISTERED:none  LOCAL MEDICATIONS USED:10cc 1% LIDOCAINE with epi paracervical blcok and 10cc diluted vasopressin intracervical for hemostasis  FINDINGS: acetowhite changes extending posteriorly in crescent from approx 4-8 oc  DESCRIPTION OF PROCEDURE:  Patient was taken to the operating room where anesthesia was administered and found to be adequate.  A speculum was placed and acetic acid applied to cervix.  Colposcope was used to delineate dysplastic area with marking pen. Single tooth tenaculum was placed on anterior lip of cervix.  10cc lidocaine with epi was administered paracervically.  Stay sutures were placed at 3 and 9 oc with 0-vicryl.  10cc vasopression then injected in clockwise fashion on face of cervix lateral to delineated area.  Scalpel was then used to incise the cervix approx 64m lateral to delineated marking from 6 to 3 oc, then 6 to 9oc.  Cervix was further incised from 12 to 3 oc and 9 to 12 oc to a depth of approx 1.5cm.  The base of the cone was transected and cone biopsy was removed and tagged at 12 oc and sent to pathology. Endocervical currettings were then obtained and sent to pathology. Bovie cautery ball was used to create hemostasis of minimal bleeding.  Tenaculum made small tear in superficial cervix anteriorly, it was removed and  0-vicryl figure of 8 was placed  prophylactically to prevent bleeding.  Monsel's solution was applied site of biopsy removal and excellent hemostasis was noted.  Stay suture tails were cut near knot.  Speculum was removed.  Pt tolerated the procedure well.   SPECIMEN:  Source of Specimen:   portio of cervix and endocervical curettings  DISPOSITION OF SPECIMEN:  PATHOLOGY  COUNTS:  YES  PLAN OF CARE: Discharge to home after PACU  PATIENT DISPOSITION:  PACU - hemodynamically stable.   Delay start of Pharmacological VTE agent (>24hrs) due to surgical blood loss or risk of bleeding: not applicable

## 2020-07-15 NOTE — Discharge Instructions (Signed)
  Post Anesthesia Home Care Instructions  Activity: Get plenty of rest for the remainder of the day. A responsible adult should stay with you for 24 hours following the procedure.  For the next 24 hours, DO NOT: -Drive a car -Advertising copywriter -Drink alcoholic beverages -Take any medication unless instructed by your physician -Make any legal decisions or sign important papers.  Meals: Start with liquid foods such as gelatin or soup. Progress to regular foods as tolerated. Avoid greasy, spicy, heavy foods. If nausea and/or vomiting occur, drink only clear liquids until the nausea and/or vomiting subsides. Call your physician if vomiting continues.  Special Instructions/Symptoms: Your throat may feel dry or sore from the anesthesia or the breathing tube placed in your throat during surgery. If this causes discomfort, gargle with warm salt water. The discomfort should disappear within 24 hours.  If you had a scopolamine patch placed behind your ear for the management of post- operative nausea and/or vomiting:  1. The medication in the patch is effective for 72 hours, after which it should be removed.  Wrap patch in a tissue and discard in the trash. Wash hands thoroughly with soap and water. 2. You may remove the patch earlier than 72 hours if you experience unpleasant side effects which may include dry mouth, dizziness or visual disturbances. 3. Avoid touching the patch. Wash your hands with soap and water after contact with the patch.   Discussed with patient that OTC tylenol and ibuprofen can be used for discomfort/pain.  No tubs, intercourse or anything in vagina until seen for follow up in office.  Call with heavy bleeding, worsening pain, fever, or other concerns.

## 2020-07-15 NOTE — Anesthesia Postprocedure Evaluation (Signed)
Anesthesia Post Note  Patient: Laura Sutton  Procedure(s) Performed: CONIZATION CERVIX WITH BIOPSY (N/A Vagina )     Patient location during evaluation: PACU Anesthesia Type: General Level of consciousness: sedated Pain management: pain level controlled Vital Signs Assessment: post-procedure vital signs reviewed and stable Respiratory status: spontaneous breathing and respiratory function stable Cardiovascular status: stable Postop Assessment: no apparent nausea or vomiting Anesthetic complications: no   No complications documented.  Last Vitals:  Vitals:   07/15/20 0927 07/15/20 0948  BP:    Pulse: 69 70  Resp: 14 13  Temp:  36.7 C  SpO2: 97% 94%    Last Pain:  Vitals:   07/15/20 0948  TempSrc: Oral  PainSc:                  [name]

## 2020-07-15 NOTE — Transfer of Care (Signed)
Immediate Anesthesia Transfer of Care Note  Patient: [name]  Procedure(s) Performed: CONIZATION CERVIX WITH BIOPSY (N/A Vagina )  Patient Location: PACU  Anesthesia Type:General  Level of Consciousness: awake, alert  and oriented  Airway & Oxygen Therapy: Patient Spontanous Breathing and Patient connected to face mask oxygen  Post-op Assessment: Report given to RN and Post -op Vital signs reviewed and stable  Post vital signs: Reviewed and stable  Last Vitals:  Vitals Value Taken Time  BP    Temp    Pulse 90 07/15/20 0831  Resp 12 07/15/20 0831  SpO2 100 % 07/15/20 0831  Vitals shown include unvalidated device data.  Last Pain:  Vitals:   07/15/20 0604  TempSrc:   PainSc: 0-No pain      Patients Stated Pain Goal: 2 (07/15/20 0604)  Complications: No complications documented.

## 2020-07-15 NOTE — Anesthesia Procedure Notes (Signed)
Procedure Name: LMA Insertion Date/Time: 07/15/2020 7:33 AM Performed by: Pearson Grippe, CRNA Pre-anesthesia Checklist: Patient identified, Emergency Drugs available, Suction available and Patient being monitored Patient Re-evaluated:Patient Re-evaluated prior to induction Oxygen Delivery Method: Circle system utilized Preoxygenation: Pre-oxygenation with 100% oxygen Induction Type: IV induction Ventilation: Mask ventilation without difficulty LMA: LMA inserted LMA Size: 4.0 Number of attempts: 1 Airway Equipment and Method: Bite block Placement Confirmation: positive ETCO2 Tube secured with: Tape Dental Injury: Teeth and Oropharynx as per pre-operative assessment

## 2020-07-16 ENCOUNTER — Encounter (HOSPITAL_BASED_OUTPATIENT_CLINIC_OR_DEPARTMENT_OTHER): Payer: Self-pay | Admitting: Obstetrics and Gynecology

## 2020-07-16 LAB — SURGICAL PATHOLOGY

## 2020-11-15 ENCOUNTER — Emergency Department (HOSPITAL_BASED_OUTPATIENT_CLINIC_OR_DEPARTMENT_OTHER): Payer: Managed Care, Other (non HMO)

## 2020-11-15 ENCOUNTER — Other Ambulatory Visit: Payer: Self-pay

## 2020-11-15 ENCOUNTER — Emergency Department (HOSPITAL_BASED_OUTPATIENT_CLINIC_OR_DEPARTMENT_OTHER)
Admission: EM | Admit: 2020-11-15 | Discharge: 2020-11-15 | Disposition: A | Discharge status: Home / Self Care | Payer: Managed Care, Other (non HMO) | Attending: Emergency Medicine | Admitting: Emergency Medicine

## 2020-11-15 ENCOUNTER — Encounter (HOSPITAL_BASED_OUTPATIENT_CLINIC_OR_DEPARTMENT_OTHER): Payer: Self-pay | Admitting: *Deleted

## 2020-11-15 DIAGNOSIS — J45909 Unspecified asthma, uncomplicated: Secondary | ICD-10-CM | POA: Diagnosis not present

## 2020-11-15 DIAGNOSIS — Z86711 Personal history of pulmonary embolism: Secondary | ICD-10-CM | POA: Insufficient documentation

## 2020-11-15 DIAGNOSIS — R059 Cough, unspecified: Secondary | ICD-10-CM | POA: Insufficient documentation

## 2020-11-15 DIAGNOSIS — R070 Pain in throat: Secondary | ICD-10-CM | POA: Insufficient documentation

## 2020-11-15 DIAGNOSIS — R0602 Shortness of breath: Secondary | ICD-10-CM | POA: Diagnosis present

## 2020-11-15 DIAGNOSIS — Z20822 Contact with and (suspected) exposure to covid-19: Secondary | ICD-10-CM | POA: Insufficient documentation

## 2020-11-15 DIAGNOSIS — R0989 Other specified symptoms and signs involving the circulatory and respiratory systems: Secondary | ICD-10-CM | POA: Diagnosis not present

## 2020-11-15 DIAGNOSIS — R079 Chest pain, unspecified: Secondary | ICD-10-CM | POA: Insufficient documentation

## 2020-11-15 DIAGNOSIS — R000 Tachycardia, unspecified: Secondary | ICD-10-CM | POA: Diagnosis not present

## 2020-11-15 HISTORY — DX: Unspecified asthma, uncomplicated: J45.909

## 2020-11-15 HISTORY — DX: Other pulmonary embolism without acute cor pulmonale: I26.99

## 2020-11-15 LAB — CBC WITH DIFFERENTIAL/PLATELET
Abs Immature Granulocytes: 0.03 10*3/uL (ref 0.00–0.07)
Basophils Absolute: 0 10*3/uL (ref 0.0–0.1)
Basophils Relative: 0 %
Eosinophils Absolute: 0.2 10*3/uL (ref 0.0–0.5)
Eosinophils Relative: 2 %
HCT: 40.8 % (ref 36.0–46.0)
Hemoglobin: 13.9 g/dL (ref 12.0–15.0)
Immature Granulocytes: 0 %
Lymphocytes Relative: 11 %
Lymphs Abs: 1 10*3/uL (ref 0.7–4.0)
MCH: 31.2 pg (ref 26.0–34.0)
MCHC: 34.1 g/dL (ref 30.0–36.0)
MCV: 91.7 fL (ref 80.0–100.0)
Monocytes Absolute: 0.3 10*3/uL (ref 0.1–1.0)
Monocytes Relative: 4 %
Neutro Abs: 7.8 10*3/uL — ABNORMAL HIGH (ref 1.7–7.7)
Neutrophils Relative %: 83 %
Platelets: 181 10*3/uL (ref 150–400)
RBC: 4.45 MIL/uL (ref 3.87–5.11)
RDW: 11.8 % (ref 11.5–15.5)
WBC: 9.3 10*3/uL (ref 4.0–10.5)
nRBC: 0 % (ref 0.0–0.2)

## 2020-11-15 LAB — RESP PANEL BY RT-PCR (FLU A&B, COVID) ARPGX2
Influenza A by PCR: NEGATIVE
Influenza B by PCR: NEGATIVE
SARS Coronavirus 2 by RT PCR: NEGATIVE

## 2020-11-15 LAB — COMPREHENSIVE METABOLIC PANEL
ALT: 16 U/L (ref 0–44)
AST: 24 U/L (ref 15–41)
Albumin: 4.3 g/dL (ref 3.5–5.0)
Alkaline Phosphatase: 67 U/L (ref 38–126)
Anion gap: 12 (ref 5–15)
BUN: 10 mg/dL (ref 6–20)
CO2: 23 mmol/L (ref 22–32)
Calcium: 9.3 mg/dL (ref 8.9–10.3)
Chloride: 101 mmol/L (ref 98–111)
Creatinine, Ser: 0.76 mg/dL (ref 0.44–1.00)
GFR, Estimated: >60 mL/min (ref >60)
Glucose, Bld: 84 mg/dL (ref 70–99)
Potassium: 3.8 mmol/L (ref 3.5–5.1)
Sodium: 136 mmol/L (ref 135–145)
Total Bilirubin: 0.8 mg/dL (ref 0.3–1.2)
Total Protein: 8.1 g/dL (ref 6.5–8.1)

## 2020-11-15 LAB — HCG, SERUM, QUALITATIVE: Preg, Serum: NEGATIVE

## 2020-11-15 LAB — BRAIN NATRIURETIC PEPTIDE: B Natriuretic Peptide: 26 pg/mL (ref 0.0–100.0)

## 2020-11-15 LAB — TROPONIN I (HIGH SENSITIVITY): Troponin I (High Sensitivity): <2 ng/L (ref <18)

## 2020-11-15 MED ORDER — IBUPROFEN 600 MG PO TABS: 600.0000 mg | ORAL_TABLET | Freq: Four times a day (QID) | ORAL | 0 refills | Status: DC | PRN

## 2020-11-15 MED ORDER — KETOROLAC TROMETHAMINE 30 MG/ML IJ SOLN
30.0000 mg | Freq: Once | INTRAMUSCULAR | Status: AC
  Administered 2020-11-15: 30 mg via INTRAVENOUS
  Filled 2020-11-15: qty 1

## 2020-11-15 MED ORDER — BENZONATATE 100 MG PO CAPS: 100.0000 mg | ORAL_CAPSULE | Freq: Three times a day (TID) | ORAL | 0 refills | Status: DC

## 2020-11-15 MED ORDER — IOHEXOL 350 MG/ML SOLN
100.0000 mL | Freq: Once | INTRAVENOUS | Status: AC | PRN
  Administered 2020-11-15: 64 mL via INTRAVENOUS

## 2020-11-15 MED ORDER — PROMETHAZINE-DM 6.25-15 MG/5ML PO SYRP: 5.0000 mL | ORAL_SOLUTION | Freq: Four times a day (QID) | ORAL | 0 refills | Status: DC | PRN

## 2020-11-15 MED ORDER — ACETAMINOPHEN 325 MG PO TABS
650.0000 mg | ORAL_TABLET | Freq: Once | ORAL | Status: AC
  Administered 2020-11-15: 650 mg via ORAL
  Filled 2020-11-15: qty 2

## 2020-11-15 MED ORDER — SODIUM CHLORIDE 0.9 % IV BOLUS
1000.0000 mL | Freq: Once | INTRAVENOUS | Status: AC
  Administered 2020-11-15: 1000 mL via INTRAVENOUS

## 2020-11-15 NOTE — ED Notes (Signed)
ED Provider at bedside. 

## 2020-11-15 NOTE — ED Provider Notes (Signed)
[facility] EMERGENCY DEPARTMENT Provider Note   CSN: [id number] Arrival date & time: 11/15/20  1346     History Chief Complaint  Patient presents with  . Shortness of Breath    Laura Sutton is a 29 y.o. female.  HPI Patient is a 29 year old female with a past medical history of obesity, gestational HTN, asthma  Patient is presented today with 4 days of symptoms.  She states that 4 days ago she states was outside a good bit of the day and felt that she has some congestion and mild sore throat that lasted for the next day as well she states that it seemed to be improving but then she noticed that she was having more shortness of breath.  She states it is worse with exertion but also worse with laying down.  She states that she has also had associated sharp stabbing chest pain that seems to be sternal and radiating to her back.  It is pleuritic in nature.  She did not attempt to prop herself up with pillows.  She denies any unilateral or bilateral leg swelling or edema.  Patient denies any fevers or chills.  No nausea or vomiting or abdominal pain.  No lightheadedness or dizziness.  She has no episodes of syncope or near syncope.  Notably patient states that she was thought to have a pulmonary embolism several years ago when she had similar symptoms was given a shot of Lovenox by her primary care doctor and sent to the ER she states that she went 1 day later and had a CT scan that showed no blood clots.  She was told that it had either disappeared or that she never had 1 and was treated for bronchitis.  She states she has had a mild cough but denies any hemoptysis.  No recent long trips hospital stays or surgeries.  She is on Nexplanon as her contraceptive which is a nonestrogen-containing contraceptive.  She denies any other exogenous estrogen use.    Past Medical History:  Diagnosis Date  . Asthma   . Cervical dysplasia   . Pregnancy induced hypertension    followed  by dr Claiborne Billings (gyn)---- SVD 05-07-2020  . Pulmonary embolism (HCC)   . Wears contact lenses     Patient Active Problem List   Diagnosis Date Noted  . Severe preeclampsia, third trimester 05/07/2020  . Chronic hypertension during pregnancy 05/02/2020    Past Surgical History:  Procedure Laterality Date  . CERVICAL CONIZATION W/BX N/A 07/15/2020   Procedure: CONIZATION CERVIX WITH BIOPSY;  Surgeon: Philip Aspen, DO;  Location: [facility];  Service: Gynecology;  Laterality: N/A;  . NO PAST SURGERIES       OB History    Gravida  3   Para  3   Term      Preterm  3   AB      Living  3     SAB      TAB      Ectopic      Multiple  0   Live Births  3           No family history on file.  Social History   Tobacco Use  . Smoking status: Never Smoker  . Smokeless tobacco: Never Used  Vaping Use  . Vaping Use: Never used  Substance Use Topics  . Alcohol use: Never  . Drug use: Yes    Types: Marijuana    Home Medications Prior to Admission  medications   Medication Sig Start Date End Date Taking? Authorizing Provider  acetaminophen (TYLENOL) 500 MG tablet Take 500 mg by mouth every 6 (six) hours as needed for mild pain or headache.    [provider]  ibuprofen (ADVIL) 600 MG tablet Take 1 tablet (600 mg total) by mouth every 6 (six) hours as needed. 11/15/20   Gailen Shelter, PA  labetalol (NORMODYNE) 200 MG tablet Take 2 tablets (400 mg total) by mouth every 8 (eight) hours. Patient taking differently: Take 200 mg by mouth 2 (two) times daily.  05/10/20   Marlow Baars, MD    Allergies    Patient has no known allergies.  Review of Systems   Review of Systems  Constitutional: Positive for fatigue. Negative for chills and fever.  HENT: Positive for congestion and rhinorrhea.   Eyes: Negative for pain.  Respiratory: Positive for cough and shortness of breath.   Cardiovascular: Positive for chest pain. Negative for leg  swelling.  Gastrointestinal: Negative for abdominal pain, diarrhea, nausea and vomiting.  Genitourinary: Negative for dysuria.  Musculoskeletal: Negative for myalgias.  Skin: Negative for rash.  Neurological: Negative for dizziness and headaches.    Physical Exam Updated Vital Signs BP 133/89   Pulse (!) 112   Temp 98.9 F (37.2 C) (Oral)   Resp 18   Ht 5\' 3"  (1.6 m)   Wt 77 kg   SpO2 95%   Breastfeeding No   BMI 30.07 kg/m   Physical Exam Vitals and nursing note reviewed.  Constitutional:      General: She is not in acute distress.    Appearance: She is obese.     Comments: Pleasant, obese 29 year old female in no acute distress sitting comfortably in bed mild tachypnea speaking in full sentences  HENT:     Head: Normocephalic and atraumatic.     Nose: Nose normal.     Mouth/Throat:     Mouth: Mucous membranes are moist.  Eyes:     General: No scleral icterus. Neck:     Comments: No JVP Cardiovascular:     Rate and Rhythm: Regular rhythm. Tachycardia present.     Pulses: Normal pulses.     Heart sounds: Normal heart sounds.     Comments: No murmurs rubs or gallops.  Heart rate is 120. Bilateral radial pulses are 3+ and symmetric Pulmonary:     Effort: Pulmonary effort is normal. No respiratory distress.     Breath sounds: No wheezing.     Comments: Lungs clear to auscultation all fields No wheezing or rhonchi or crackles. Respiratory rate approximately 22-24.  SPO2 94% on room air. Abdominal:     Palpations: Abdomen is soft.     Tenderness: There is no abdominal tenderness.  Musculoskeletal:     Cervical back: Normal range of motion.     Right lower leg: No edema.     Left lower leg: No edema.     Comments: No lower extremity edema.  No significant swelling.  No calf tenderness.  Negative Homan sign  Skin:    General: Skin is warm and dry.     Capillary Refill: Capillary refill takes less than 2 seconds.  Neurological:     Mental Status: She is alert.  Mental status is at baseline.  Psychiatric:        Mood and Affect: Mood normal.        Behavior: Behavior normal.     ED Results / Procedures / Treatments  Labs (all labs ordered are listed, but only abnormal results are displayed) Labs Reviewed  CBC WITH DIFFERENTIAL/PLATELET - Abnormal; Notable for the following components:      Result Value   Neutro Abs 7.8 (*)    All other components within normal limits  RESP PANEL BY RT-PCR (FLU A&B, COVID) [id number]  COMPREHENSIVE METABOLIC PANEL  BRAIN NATRIURETIC PEPTIDE  HCG, SERUM, QUALITATIVE  TROPONIN I (HIGH SENSITIVITY)    EKG EKG Interpretation  Date/Time:  Saturday November 15 2020 14:02:51 EST Ventricular Rate:  120 PR Interval:    QRS Duration: 82 QT Interval:  317 QTC Calculation: 448 R Axis:   64 Text Interpretation: Sinus tachycardia Nonspecific T abnormalities, inferior leads No significant change since last tracing Confirmed by Jacalyn Lefevre [phone]) on 11/15/2020 2:21:21 PM   Radiology DG Chest 2 View  Result Date: 11/15/2020 CLINICAL DATA:  Shortness of breath. EXAM: CHEST - 2 VIEW COMPARISON:  None. FINDINGS: Normal cardiac silhouette and mediastinal contours. No focal parenchymal opacities. No pleural effusion or pneumothorax. No evidence of edema. No acute osseous abnormalities. IMPRESSION: No acute cardiopulmonary disease. Electronically Signed   By: Simonne Come M.D.   On: 11/15/2020 14:26   CT Angio Chest PE W/Cm &/Or Wo Cm  Result Date: 11/15/2020 CLINICAL DATA:  Shortness of breath while lying flat and with exertion. Slight cough. Suspected pulmonary embolus. EXAM: CT ANGIOGRAPHY CHEST WITH CONTRAST TECHNIQUE: Multidetector CT imaging of the chest was performed using the standard protocol during bolus administration of intravenous contrast. Multiplanar CT image reconstructions and MIPs were obtained to evaluate the vascular anatomy. CONTRAST:  37mL OMNIPAQUE IOHEXOL 350 MG/ML SOLN COMPARISON:  Chest x-ray  11/15/2020 FINDINGS: Cardiovascular: Satisfactory opacification of the pulmonary arteries to the segmental level. No evidence of pulmonary embolism. The main pulmonary artery is normal in caliber. Normal heart size. No pericardial effusion. The thoracic aorta is normal in caliber. Incidentally noted aberrant right subclavian artery. Mediastinum/Nodes: Trace soft tissue density within the anterior mediastinum likely related to residual thymus tissue. No enlarged mediastinal, hilar, or axillary lymph nodes. Thyroid gland, trachea, and esophagus demonstrate no significant findings. Lungs/Pleura: No focal consolidation. Calcified pulmonary micronodule within the left lower lobe (5:60). No pulmonary mass. No pleural effusion. No pneumothorax. Upper Abdomen: No acute abnormality. Musculoskeletal: No chest wall abnormality No suspicious lytic or blastic osseous lesions. No acute displaced fracture. Multilevel degenerative changes of the spine. Review of the MIP images confirms the above findings. IMPRESSION: 1. No pulmonary embolus. 2. No acute intrathoracic abnormality. Electronically Signed   By: Tish Frederickson M.D.   On: 11/15/2020 16:01    Procedures Procedures (including critical care time)  Medications Ordered in ED Medications  acetaminophen (TYLENOL) tablet 650 mg (650 mg Oral Given 11/15/20 1440)  sodium chloride 0.9 % bolus 1,000 mL (0 mLs Intravenous Stopped 11/15/20 1540)  iohexol (OMNIPAQUE) 350 MG/ML injection 100 mL (64 mLs Intravenous Contrast Given 11/15/20 1541)  ketorolac (TORADOL) 30 MG/ML injection 30 mg (30 mg Intravenous Given 11/15/20 1634)    ED Course  I have reviewed the triage vital signs and the nursing notes.  Pertinent labs & imaging results that were available during my care of the patient were reviewed by me and considered in my medical decision making (see chart for details).  Clinical Course as of Nov 15 1757  Sat Nov 15, 2020  1634 PE study without any acute  abnormalities.  No effusion, no pulmonary emboli, no evidence of dissection.  On reassessment patient states she feels  50% better after 1 dose of Tylenol.  Still feels somewhat short of breath her SPO2 has been between 94 and 100 on room air.  Will ambulate patient and plan to discharge after.  Her tachycardia has improved with fluids.   [WF]    Clinical Course User Index [WF] Gailen ShelterFondaw, Tiberius Loftus S, [place]   MDM Rules/Calculators/A&P                          Patient is an obese 29 year old female presented today with shortness of breath for the past 2 days with associated chest pain is sharp and pleuritic in nature.  She has no history of PE although she did have a questionable episode several years ago where she was thought to possibly have a pulmonary embolism was started on Lovenox and then had a follow-up CT scan was negative for blood clots.  She does describe her chest pain is radiating to her back my suspicion for dissection is relatively low she is young has no marfanoid characteristics has no family medical history of dissections and has reassuring physical exam she is mildly hypertensive and tachycardic however.  Other considerations are myocarditis, pericarditis and PE of these I believe PE to be less likely.  Patient is not have any significant risk factors for PE apart from obesity however her symptoms are classic for pulmonary embolism.  She has borderline hypoxia with SPO2 94% on room air with tachypnea which is likely compensating for the VQ mismatch.  Physical exam is notable for tachycardia, tachypnea, borderline SPO2, she is not obese female she does not appear to be in any acute distress.  She also has no lower extremity edema or palpable calf tenderness.  She is fully vaccinated for Covid.  Will obtain Covid testing, basic lab work, chest x-ray and CT PE study to rule out PE. Will also obtain troponin and BNP given concern for possible PE/myocarditis.  hCG negative for pregnancy.   Troponin x1 within normal limits/undetectable.  Covid test is negative.  CBC without leukocytosis or anemia. CMP without electrolyte derangement. BMP within normal limits Chest x-ray reviewed by myself shows no acute abnormality. CT PE study was negative for pulmonary embolism.  There is also no pericardial effusion.   EKG is sinus tachycardia with no PR depressions or ST T wave abnormalities.   Relatively low suspicion for pericarditis however patient's pain is approximately 60% improved after Tylenol alone.  Will provide patient with 1 dose of Toradol and discharge  with precautions.  Costochondritis versus pericarditis are the most likely diagnosis.  We will also provide patient with cough medicine such as benzonatate and Promethazine DM.  Final Clinical Impression(s) / ED Diagnoses Final diagnoses:  Chest pain, unspecified type    Rx / DC Orders ED Discharge Orders         Ordered    ibuprofen (ADVIL) 600 MG tablet  Every 6 hours PRN        11/15/20 1758           Gailen ShelterFondaw, Cozetta Seif S, [place] 11/15/20 1800    Little, Ambrose Finlandachel Morgan, MD 11/16/20 [phone]

## 2020-11-15 NOTE — Discharge Instructions (Addendum)
Your work-up today was reassuring.  As we discussed this could be related to some inflammation in your chest either of the chest wall called costochondritis or of the tissue surrounding the heart which were called pericarditis.  He did not have any evidence on her EKG of pericarditis however both of these conditions are treated with ibuprofen.  Please drink plenty of water take ibuprofen 600 mg every 6 hours and you may alternate with Tylenol 1000 mg every 6 hours that way you are taking something every 3 hours.  Please follow-up with your primary care doctor.  You may always return to the ER for any new or concerning symptoms.

## 2020-11-15 NOTE — ED Triage Notes (Signed)
Pt reports difficulty sleeping last night, reports  SOB while laying flat and with exertion. Slight cough.. She has been vaccinated for covid.

## 2021-02-12 LAB — HM PAP SMEAR

## 2021-05-11 ENCOUNTER — Emergency Department (HOSPITAL_BASED_OUTPATIENT_CLINIC_OR_DEPARTMENT_OTHER)
Admission: EM | Admit: 2021-05-11 | Discharge: 2021-05-11 | Disposition: A | Discharge status: Home / Self Care | Payer: Managed Care, Other (non HMO) | Attending: Emergency Medicine | Admitting: Emergency Medicine

## 2021-05-11 ENCOUNTER — Other Ambulatory Visit: Payer: Self-pay

## 2021-05-11 ENCOUNTER — Encounter (HOSPITAL_BASED_OUTPATIENT_CLINIC_OR_DEPARTMENT_OTHER): Payer: Self-pay

## 2021-05-11 DIAGNOSIS — R070 Pain in throat: Secondary | ICD-10-CM | POA: Diagnosis present

## 2021-05-11 DIAGNOSIS — J029 Acute pharyngitis, unspecified: Secondary | ICD-10-CM | POA: Insufficient documentation

## 2021-05-11 DIAGNOSIS — J45909 Unspecified asthma, uncomplicated: Secondary | ICD-10-CM | POA: Insufficient documentation

## 2021-05-11 DIAGNOSIS — I10 Essential (primary) hypertension: Secondary | ICD-10-CM

## 2021-05-11 LAB — GROUP A STREP BY PCR: Group A Strep by PCR: NOT DETECTED

## 2021-05-11 MED ORDER — AMLODIPINE BESYLATE 5 MG PO TABS: 5.0000 mg | ORAL_TABLET | Freq: Every day | ORAL | 0 refills | Status: DC

## 2021-05-11 MED ORDER — IBUPROFEN 400 MG PO TABS
600.0000 mg | ORAL_TABLET | Freq: Once | ORAL | Status: AC
  Administered 2021-05-11: 600 mg via ORAL
  Filled 2021-05-11: qty 1

## 2021-05-11 NOTE — Discharge Instructions (Addendum)
Please gargle warm salt water, use throat lozenges to help soothe your throat.  Your strep test was negative today.  Your blood pressure is significantly elevated.  Given that you have no symptoms today I feel comfortable sending you home with amlodipine.  This has less significant side effects then will be labetalol that you are on in the past.  I suspect you will tolerate this medication better.  Please follow-up closely with your primary care doctor to have your blood pressure rechecked.

## 2021-05-11 NOTE — ED Triage Notes (Signed)
Pt states she had a cold 2 weeks ago and has continued throat pain. All other symptoms have resolved per pt.

## 2021-05-11 NOTE — ED Provider Notes (Signed)
[facility] EMERGENCY DEPARTMENT Provider Note   CSN: [id number] Arrival date & time: 05/11/21  1832     History Chief Complaint  Patient presents with  . Sore Throat    Laura Sutton is a 30 y.o. female.  HPI Patient is a 30 year old female with past medical history detailed below for sore throat.  She states that she had cold-like symptoms 2 weeks ago she states that she never got tested for COVID or influenza.  She states that she has had a sore throat that is continued since that time that is moderate seems to improve with ibuprofen and has relief with ibuprofen for as long as 12 hours however she states that seems that her pain returns after the period of time.  She states it is achy worse with swallowing.  She denies any nausea vomiting chest pain shortness of breath cough congestion lightheadedness or dizziness.  Denies any changes to her voice.  She is able to eat and drink without difficulty although it is somewhat uncomfortable to swallow.  No fevers or chills.  No other associate symptoms.    Past Medical History:  Diagnosis Date  . Asthma   . Cervical dysplasia   . Pregnancy induced hypertension    followed by dr Claiborne Billings (gyn)---- SVD 05-07-2020  . Pulmonary embolism (HCC)   . Wears contact lenses     Patient Active Problem List   Diagnosis Date Noted  . Severe preeclampsia, third trimester 05/07/2020  . Chronic hypertension during pregnancy 05/02/2020    Past Surgical History:  Procedure Laterality Date  . CERVICAL CONIZATION W/BX N/A 07/15/2020   Procedure: CONIZATION CERVIX WITH BIOPSY;  Surgeon: Philip Aspen, DO;  Location: [facility] ;  Service: Gynecology;  Laterality: N/A;  . NO PAST SURGERIES       OB History    Gravida  3   Para  3   Term      Preterm  3   AB      Living  3     SAB      IAB      Ectopic      Multiple  0   Live Births  3           No family history on file.  Social  History   Tobacco Use  . Smoking status: Never Smoker  . Smokeless tobacco: Never Used  Vaping Use  . Vaping Use: Never used  Substance Use Topics  . Alcohol use: Never  . Drug use: Yes    Types: Marijuana    Home Medications Prior to Admission medications   Medication Sig Start Date End Date Taking? Authorizing Provider  acetaminophen (TYLENOL) 500 MG tablet Take 500 mg by mouth every 6 (six) hours as needed for mild pain or headache.    [provider]  benzonatate (TESSALON) 100 MG capsule Take 1 capsule (100 mg total) by mouth every 8 (eight) hours. 11/15/20   Gailen Shelter, PA  ibuprofen (ADVIL) 600 MG tablet Take 1 tablet (600 mg total) by mouth every 6 (six) hours as needed. 11/15/20   Gailen Shelter, PA  labetalol (NORMODYNE) 200 MG tablet Take 2 tablets (400 mg total) by mouth every 8 (eight) hours. Patient taking differently: Take 200 mg by mouth 2 (two) times daily.  05/10/20   Marlow Baars, MD  promethazine-dextromethorphan (PROMETHAZINE-DM) 6.25-15 MG/5ML syrup Take 5 mLs by mouth 4 (four) times daily as needed for cough. 11/15/20  Gailen Shelter, PA    Allergies    Patient has no known allergies.  Review of Systems   Review of Systems  Constitutional: Negative for chills and fever.  HENT: Positive for sore throat. Negative for congestion.   Eyes: Negative for pain.  Respiratory: Negative for cough.   Cardiovascular: Negative for chest pain.  Gastrointestinal: Negative for abdominal pain.  Genitourinary: Negative for dysuria.  Musculoskeletal: Negative for myalgias.  Skin: Negative for rash.  Neurological: Negative for dizziness.    Physical Exam Updated Vital Signs BP (!) 152/114 (BP Location: Left Arm)   Pulse 90   Temp 98.7 F (37.1 C) (Oral)   Resp 18   Ht 5\' 3"  (1.6 m)   Wt 74.8 kg   LMP  (LMP Unknown)   SpO2 100%   BMI 29.23 kg/m   Physical Exam Vitals and nursing note reviewed.  Constitutional:      General: She is not in  acute distress. HENT:     Head: Normocephalic and atraumatic.     Nose: Nose normal.     Mouth/Throat:     Mouth: Mucous membranes are moist.     Comments: Some posterior pharynx erythema.  Mild bilateral tonsilar hypertrophy . No exudates Eyes:     General: No scleral icterus. Cardiovascular:     Rate and Rhythm: Normal rate and regular rhythm.     Pulses: Normal pulses.     Heart sounds: Normal heart sounds.  Pulmonary:     Effort: Pulmonary effort is normal. No respiratory distress.     Breath sounds: Normal breath sounds. No wheezing.     Comments: Lungs are clear in all fields Abdominal:     Palpations: Abdomen is soft.     Tenderness: There is no abdominal tenderness. There is no guarding or rebound.  Musculoskeletal:     Cervical back: Normal range of motion.     Right lower leg: No edema.     Left lower leg: No edema.  Lymphadenopathy:     Cervical: Cervical adenopathy present.  Skin:    General: Skin is warm and dry.     Capillary Refill: Capillary refill takes less than 2 seconds.  Neurological:     Mental Status: She is alert. Mental status is at baseline.  Psychiatric:        Mood and Affect: Mood normal.        Behavior: Behavior normal.     ED Results / Procedures / Treatments   Labs (all labs ordered are listed, but only abnormal results are displayed) Labs Reviewed  GROUP A STREP BY PCR    EKG None  Radiology No results found.  Procedures Procedures   Medications Ordered in ED Medications  ibuprofen (ADVIL) tablet 600 mg (has no administration in time range)    ED Course  I have reviewed the triage vital signs and the nursing notes.  Pertinent labs & imaging results that were available during my care of the patient were reviewed by me and considered in my medical decision making (see chart for details).    MDM Rules/Calculators/A&P                          Patient is a well-appearing 30 year old female in no acute distress.  She  speaking in full sentences.  Seems to be managing her secretions without difficulty.  She is here for sore throat seems that has been ongoing for 2 weeks.  She has no other significant associated symptoms.  She seems to have significant relief of ibuprofen however symptoms return after medication wears off.  Bilateral tonsils are somewhat hypertrophic.  No tonsillar exudate.  Uvula is midline.  Normal phonation.  Doubt RTA, PTA or other deep space infection.  Patient given ibuprofen, rapid PCR test negative for strep throat.   Patient has had repeated elevated blood pressures even after her sore throat completely resolved with ibuprofen.  She states that she supposed be taking labetalol but stopped taking it made her feel very drowsy.  Given that this is not a first-line blood pressure medication I believe patient would benefit more from amlodipine.  We will prescribe her 5 mg tablets for 30-day supply she will follow-up with PCP for recheck.  Patient discharged at this time.  She has no chest pain shortness of breath or any other symptoms indicate hypertensive urgency or emergency.  Return precautions given.  Patient discharged ambulatory at time of leaving the department.  Final Clinical Impression(s) / ED Diagnoses Final diagnoses:  Pharyngitis, unspecified etiology    Rx / DC Orders ED Discharge Orders    None       Gailen Shelter, [place] 05/11/21 2135    Maia Plan, MD 05/14/21 [phone]

## 2021-05-11 NOTE — ED Notes (Signed)
Pt states she has a hx of high blood pressure and is not taking her medication

## 2021-06-10 ENCOUNTER — Ambulatory Visit (INDEPENDENT_AMBULATORY_CARE_PROVIDER_SITE_OTHER): Payer: Managed Care, Other (non HMO) | Admitting: Nurse Practitioner

## 2021-06-10 ENCOUNTER — Encounter: Payer: Self-pay | Admitting: Nurse Practitioner

## 2021-06-10 ENCOUNTER — Other Ambulatory Visit: Payer: Self-pay

## 2021-06-10 VITALS — BP 124/80 | HR 84 | Temp 97.9°F | Ht 62.2 in | Wt 171.6 lb

## 2021-06-10 DIAGNOSIS — I10 Essential (primary) hypertension: Secondary | ICD-10-CM

## 2021-06-10 DIAGNOSIS — Z7689 Persons encountering health services in other specified circumstances: Secondary | ICD-10-CM

## 2021-06-10 DIAGNOSIS — E6609 Other obesity due to excess calories: Secondary | ICD-10-CM

## 2021-06-10 DIAGNOSIS — Z6831 Body mass index (BMI) 31.0-31.9, adult: Secondary | ICD-10-CM

## 2021-06-10 DIAGNOSIS — R221 Localized swelling, mass and lump, neck: Secondary | ICD-10-CM | POA: Diagnosis not present

## 2021-06-10 DIAGNOSIS — Z13228 Encounter for screening for other metabolic disorders: Secondary | ICD-10-CM | POA: Diagnosis not present

## 2021-06-10 MED ORDER — AMLODIPINE BESYLATE 5 MG PO TABS: 5.0000 mg | ORAL_TABLET | Freq: Every day | ORAL | 1 refills | Status: DC

## 2021-06-10 NOTE — Progress Notes (Signed)
I,Yamilka Roman [employer] as a Education administrator for [employer], FNP.,have documented all relevant documentation on the behalf of Minette Brine, FNP,as directed by  Minette Brine, FNP while in the presence of Minette Brine, [place].  This visit occurred during the SARS-CoV-2 public health emergency.  Safety protocols were in place, including screening questions prior to the visit, additional usage of staff PPE, and extensive cleaning of exam room while observing appropriate contact time as indicated for disinfecting solutions.  Subjective:     Patient ID: Laura Sutton , female    DOB: [birth year] , 30 y.o.   MRN: [id number]   Chief Complaint  Patient presents with   Establish Care   Hypertension    HPI  Patient presents today to establish primary care.  She has not seen primary care provider in several years. She sees Dr Rogue Bussing at [facility]. she would like to be seen for her blood pressure.  She is married and works in [place].  She has 3 children all healthy (88 y/o, 11 y/o, 1 y.o.)   She went to the ER one month ago her blood pressure was elevated.  She was started on amlodipine.  After she had her baby her blood pressure was elevated, but it had improved. Her last 2 pregnancies she had hypertension. She has started exercising, eating better and drinking more water - approximately 72 oz water a day.   [facility] - paternal grandmother - HTN, sister - diabetes. Mother - healthy, Father - ? Hypertension. Maternal great grandmother - breast cancer.   Wt Readings from Last 3 Encounters: 06/10/21 : 171 lb 9.6 oz (77.8 kg) 05/11/21 : 165 lb (74.8 kg) 11/15/20 : 169 lb 12.1 oz (77 kg)    Hypertension Pertinent negatives include no chest pain or palpitations.    Past Medical History:  Diagnosis Date   Asthma    Cervical dysplasia    Pregnancy induced hypertension    followed by dr Rogue Bussing (gyn)---- SVD 05-07-2020   Pulmonary embolism ([place])    Wears contact lenses      History reviewed. No  pertinent family history.   Current Outpatient Medications:    amLODipine (NORVASC) 5 MG tablet, Take 1 tablet (5 mg total) by mouth daily., Disp: 90 tablet, Rfl: 1   No Known Allergies   Review of Systems  Constitutional: Negative.   Respiratory: Negative.    Cardiovascular: Negative.  Negative for chest pain, palpitations and leg swelling.  Neurological: Negative.   Psychiatric/Behavioral: Negative.      Today's Vitals   06/10/21 1443  BP: 124/80  Pulse: 84  Temp: 97.9 F (36.6 C)  SpO2: 96%  Weight: 171 lb 9.6 oz (77.8 kg)  Height: 5' 2.2" (1.58 m)  PainSc: 0-No pain   Body mass index is 31.18 kg/m.   Objective:  Physical Exam Vitals reviewed.  Constitutional:      General: She is not in acute distress.    Appearance: Normal appearance. She is well-developed. She is obese.  HENT:     Head: Normocephalic and atraumatic.  Eyes:     Pupils: Pupils are equal, round, and reactive to light.  Neck:     Comments: Neck fullness Cardiovascular:     Rate and Rhythm: Normal rate and regular rhythm.     Pulses: Normal pulses.     Heart sounds: Normal heart sounds. No murmur heard. Pulmonary:     Effort: Pulmonary effort is normal. No respiratory distress.     Breath sounds: Normal breath  sounds. No wheezing.  Musculoskeletal:        General: Normal range of motion.     Cervical back: Normal range of motion and neck supple. No rigidity or tenderness.  Skin:    General: Skin is warm and dry.     Capillary Refill: Capillary refill takes less than 2 seconds.  Neurological:     General: No focal deficit present.     Mental Status: She is alert and oriented to person, place, and time.     Cranial Nerves: No cranial nerve deficit.  Psychiatric:        Mood and Affect: Mood normal.        Behavior: Behavior normal.        Thought Content: Thought content normal.        Judgment: Judgment normal.        Assessment And Plan:     1. Primary hypertension Blood pressure  is under good control Refill amlodipine - CMP14+EGFR - TSH - amLODipine (NORVASC) 5 MG tablet; Take 1 tablet (5 mg total) by mouth daily.  Dispense: 90 tablet; Refill: 1  2. Class 1 obesity due to excess calories with body mass index (BMI) of 31.0 to 31.9 in adult, unspecified whether serious comorbidity present Chronic Discussed healthy diet and regular exercise options  Encouraged to exercise at least 150 minutes per week with 2 days of strength training - Hemoglobin A1c  3. Neck fullness No nodules palpated will check thyroid levels - TSH  4. Encounter for screening for metabolic disorder - CBC - Hemoglobin A1c - Lipid panel  5. Establishing care with new doctor, encounter for    Patient was given opportunity to ask questions. Patient verbalized understanding of the plan and was able to repeat key elements of the plan. All questions were answered to their satisfaction.  Minette Brine, FNP   I, Minette Brine, FNP, have reviewed all documentation for this visit. The documentation on 07/12/21 for the exam, diagnosis, procedures, and orders are all accurate and complete.   IF YOU HAVE BEEN REFERRED TO A SPECIALIST, IT MAY TAKE 1-2 WEEKS TO SCHEDULE/PROCESS THE REFERRAL. IF YOU HAVE NOT HEARD FROM US/SPECIALIST IN TWO WEEKS, PLEASE GIVE [place] A CALL AT [phone].   THE PATIENT IS ENCOURAGED TO PRACTICE SOCIAL DISTANCING DUE TO THE COVID-19 PANDEMIC.

## 2021-06-10 NOTE — Patient Instructions (Signed)

## 2021-06-11 LAB — LIPID PANEL
Chol/HDL Ratio: 3 ratio (ref 0.0–4.4)
Cholesterol, Total: 152 mg/dL (ref 100–199)
HDL: 51 mg/dL (ref >39)
LDL Chol Calc (NIH): 85 mg/dL (ref 0–99)
Triglycerides: 84 mg/dL (ref 0–149)
VLDL Cholesterol Cal: 16 mg/dL (ref 5–40)

## 2021-06-11 LAB — CMP14+EGFR
ALT: 11 IU/L (ref 0–32)
AST: 15 IU/L (ref 0–40)
Albumin/Globulin Ratio: 1.6 (ref 1.2–2.2)
Albumin: 4.1 g/dL (ref 3.9–5.0)
Alkaline Phosphatase: 60 IU/L (ref 44–121)
BUN/Creatinine Ratio: 13 (ref 9–23)
BUN: 10 mg/dL (ref 6–20)
Bilirubin Total: <0.2 mg/dL (ref 0.0–1.2)
CO2: 23 mmol/L (ref 20–29)
Calcium: 9.4 mg/dL (ref 8.7–10.2)
Chloride: 103 mmol/L (ref 96–106)
Creatinine, Ser: 0.79 mg/dL (ref 0.57–1.00)
Globulin, Total: 2.6 g/dL (ref 1.5–4.5)
Glucose: 89 mg/dL (ref 65–99)
Potassium: 4.3 mmol/L (ref 3.5–5.2)
Sodium: 139 mmol/L (ref 134–144)
Total Protein: 6.7 g/dL (ref 6.0–8.5)
eGFR: 104 mL/min/{1.73_m2} (ref >59)

## 2021-06-11 LAB — HEMOGLOBIN A1C
Est. average glucose Bld gHb Est-mCnc: 94 mg/dL
Hgb A1c MFr Bld: 4.9 % (ref 4.8–5.6)

## 2021-06-11 LAB — CBC
Hematocrit: 36.8 % (ref 34.0–46.6)
Hemoglobin: 12.5 g/dL (ref 11.1–15.9)
MCH: 31.3 pg (ref 26.6–33.0)
MCHC: 34 g/dL (ref 31.5–35.7)
MCV: 92 fL (ref 79–97)
Platelets: 223 10*3/uL (ref 150–450)
RBC: 4 x10E6/uL (ref 3.77–5.28)
RDW: 12.1 % (ref 11.7–15.4)
WBC: 4.4 10*3/uL (ref 3.4–10.8)

## 2021-06-11 LAB — TSH: TSH: 0.647 u[IU]/mL (ref 0.450–4.500)

## 2021-06-12 ENCOUNTER — Encounter: Payer: Self-pay | Admitting: Nurse Practitioner

## 2021-06-18 ENCOUNTER — Encounter: Payer: Self-pay | Admitting: Nurse Practitioner

## 2021-09-09 ENCOUNTER — Encounter: Payer: Self-pay | Admitting: Nurse Practitioner

## 2021-09-09 ENCOUNTER — Other Ambulatory Visit: Payer: Self-pay

## 2021-09-09 ENCOUNTER — Ambulatory Visit (INDEPENDENT_AMBULATORY_CARE_PROVIDER_SITE_OTHER): Payer: Managed Care, Other (non HMO) | Admitting: Nurse Practitioner

## 2021-09-09 VITALS — BP 138/76 | HR 88 | Temp 98.7°F | Ht 63.2 in | Wt 166.6 lb

## 2021-09-09 DIAGNOSIS — I10 Essential (primary) hypertension: Secondary | ICD-10-CM | POA: Diagnosis not present

## 2021-09-09 DIAGNOSIS — Z2821 Immunization not carried out because of patient refusal: Secondary | ICD-10-CM

## 2021-09-09 DIAGNOSIS — E663 Overweight: Secondary | ICD-10-CM | POA: Diagnosis not present

## 2021-09-09 NOTE — Progress Notes (Signed)
I,Tianna Badgett,acting as a Neurosurgeon for [employer], FNP.,have documented all relevant documentation on the behalf of Arnette Felts, FNP,as directed by  Arnette Felts, FNP while in the presence of Arnette Felts, FNP.  This visit occurred during the SARS-CoV-2 public health emergency.  Safety protocols were in place, including screening questions prior to the visit, additional usage of staff PPE, and extensive cleaning of exam room while observing appropriate contact time as indicated for disinfecting solutions.  Subjective:     Patient ID: Laura Sutton , female    DOB: 30-Aug-1991 , 30 y.o.   MRN: [id number]   Chief Complaint  Patient presents with   Hypertension    HPI  Patient is here for HTN follow up. Tolerating medications well.   Wt Readings from Last 3 Encounters: 09/09/21 : 166 lb 9.6 oz (75.6 kg) 06/10/21 : 171 lb 9.6 oz (77.8 kg) 05/11/21 : 165 lb (74.8 kg)    Hypertension This is a chronic problem. Pertinent negatives include no chest pain or palpitations.    Past Medical History:  Diagnosis Date   Asthma    Cervical dysplasia    Pregnancy induced hypertension    followed by dr Claiborne Billings (gyn)---- SVD 05-07-2020   Pulmonary embolism (HCC)    Wears contact lenses      History reviewed. No pertinent family history.   Current Outpatient Medications:    amLODipine (NORVASC) 5 MG tablet, Take 1 tablet (5 mg total) by mouth daily., Disp: 90 tablet, Rfl: 1   No Known Allergies   Review of Systems  Constitutional: Negative.   Respiratory: Negative.    Cardiovascular: Negative.  Negative for chest pain, palpitations and leg swelling.  Gastrointestinal: Negative.   Neurological: Negative.     Today's Vitals   09/09/21 1605  BP: 138/76  Pulse: 88  Temp: 98.7 F (37.1 C)  TempSrc: Oral  Weight: 166 lb 9.6 oz (75.6 kg)  Height: 5' 3.2" (1.605 m)   Body mass index is 29.33 kg/m.  Wt Readings from Last 3 Encounters:  09/09/21 166 lb 9.6 oz (75.6 kg)   06/10/21 171 lb 9.6 oz (77.8 kg)  05/11/21 165 lb (74.8 kg)    Objective:  Physical Exam Vitals reviewed.  Constitutional:      General: She is not in acute distress.    Appearance: Normal appearance. She is well-developed. She is obese.  HENT:     Head: Normocephalic and atraumatic.  Eyes:     Pupils: Pupils are equal, round, and reactive to light.  Cardiovascular:     Rate and Rhythm: Normal rate and regular rhythm.     Pulses: Normal pulses.     Heart sounds: Normal heart sounds. No murmur heard. Pulmonary:     Effort: Pulmonary effort is normal. No respiratory distress.     Breath sounds: Normal breath sounds. No wheezing.  Musculoskeletal:        General: Normal range of motion.  Skin:    General: Skin is warm and dry.     Capillary Refill: Capillary refill takes less than 2 seconds.  Neurological:     General: No focal deficit present.     Mental Status: She is alert and oriented to person, place, and time.     Cranial Nerves: No cranial nerve deficit.  Psychiatric:        Mood and Affect: Mood normal.        Behavior: Behavior normal.        Thought Content: Thought content normal.  Judgment: Judgment normal.        Assessment And Plan:     1. Primary hypertension Comments: Blood pressure is under good control Continue current medications  2. Influenza vaccination declined Patient declined influenza vaccination at this time. Patient is aware that influenza vaccine prevents illness in 70% of healthy people, and reduces hospitalizations to 30-70% in elderly. This vaccine is recommended annually. Pt is willing to accept risk associated with refusing vaccination.  3. Overweight (BMI 25.0-29.9)  She is encouraged to strive for BMI less than 26 to decrease cardiac risk. Advised to aim for at least 150 minutes of exercise per week.    Patient was given opportunity to ask questions. Patient verbalized understanding of the plan and was able to repeat key  elements of the plan. All questions were answered to their satisfaction.  Arnette Felts, FNP   I, Arnette Felts, FNP, have reviewed all documentation for this visit. The documentation on 09/09/21 for the exam, diagnosis, procedures, and orders are all accurate and complete.   IF YOU HAVE BEEN REFERRED TO A SPECIALIST, IT MAY TAKE 1-2 WEEKS TO SCHEDULE/PROCESS THE REFERRAL. IF YOU HAVE NOT HEARD FROM US/SPECIALIST IN TWO WEEKS, PLEASE GIVE [place] A CALL AT [phone].   THE PATIENT IS ENCOURAGED TO PRACTICE SOCIAL DISTANCING DUE TO THE COVID-19 PANDEMIC.

## 2021-09-09 NOTE — Patient Instructions (Signed)

## 2021-11-19 ENCOUNTER — Encounter: Payer: Self-pay | Admitting: Nurse Practitioner

## 2021-11-21 ENCOUNTER — Encounter: Payer: Self-pay | Admitting: Nurse Practitioner

## 2021-11-23 ENCOUNTER — Other Ambulatory Visit: Payer: Self-pay

## 2021-11-23 DIAGNOSIS — I10 Essential (primary) hypertension: Secondary | ICD-10-CM

## 2021-11-23 MED ORDER — AMLODIPINE BESYLATE 5 MG PO TABS: 5.0000 mg | ORAL_TABLET | Freq: Every day | ORAL | 1 refills | Status: DC

## 2021-12-24 ENCOUNTER — Ambulatory Visit (INDEPENDENT_AMBULATORY_CARE_PROVIDER_SITE_OTHER): Payer: Managed Care, Other (non HMO) | Admitting: Nurse Practitioner

## 2021-12-24 ENCOUNTER — Other Ambulatory Visit: Payer: Self-pay

## 2021-12-24 ENCOUNTER — Encounter: Payer: Self-pay | Admitting: Nurse Practitioner

## 2021-12-24 VITALS — BP 126/88 | HR 99 | Temp 98.6°F | Ht 63.2 in | Wt 165.2 lb

## 2021-12-24 DIAGNOSIS — Z0000 Encounter for general adult medical examination without abnormal findings: Secondary | ICD-10-CM | POA: Diagnosis not present

## 2021-12-24 DIAGNOSIS — R0683 Snoring: Secondary | ICD-10-CM | POA: Diagnosis not present

## 2021-12-24 DIAGNOSIS — G4452 New daily persistent headache (NDPH): Secondary | ICD-10-CM

## 2021-12-24 DIAGNOSIS — I10 Essential (primary) hypertension: Secondary | ICD-10-CM

## 2021-12-24 LAB — POCT URINALYSIS DIPSTICK
Glucose, UA: NEGATIVE
Ketones, UA: POSITIVE
Leukocytes, UA: NEGATIVE
Nitrite, UA: NEGATIVE
Protein, UA: POSITIVE — AB
Spec Grav, UA: 1.025 (ref 1.010–1.025)
Urobilinogen, UA: 1 E.U./dL
pH, UA: 7 (ref 5.0–8.0)

## 2021-12-24 LAB — POCT UA - MICROALBUMIN
Albumin/Creatinine Ratio, Urine, POC: 30
Creatinine, POC: 300 mg/dL
Microalbumin Ur, POC: 30 mg/L

## 2021-12-24 MED ORDER — FLUTICASONE PROPIONATE 50 MCG/ACT NA SUSP: 2.0000 | Freq: Every day | NASAL | 2 refills | Status: DC

## 2021-12-24 NOTE — Progress Notes (Signed)
I,Yamilka J Llittleton,acting as a Education administrator for [employer], FNP.,have documented all relevant documentation on the behalf of Minette Brine, FNP,as directed by  Minette Brine, FNP while in the presence of Minette Brine, [place].   This visit occurred during the SARS-CoV-2 public health emergency.  Safety protocols were in place, including screening questions prior to the visit, additional usage of staff PPE, and extensive cleaning of exam room while observing appropriate contact time as indicated for disinfecting solutions.  Subjective:     Patient ID: Laura Sutton , female    DOB: Sep 17, 1991 , 31 y.o.   MRN: [id number]   Chief Complaint  Patient presents with   Annual Exam    HPI  Patient presents today for a physical. Patient goes to [facility] for her GYN care. She had a pap done on 11/19/21 reported to be normal, she does not have to go back for one year. She has been having some headaches the last time she had headaches was due to her elevated blood pressure. She had been congested the whole month of December. Headaches began at the end of November. She does not take any nasal sprays but has taken allegra. She also has headaches with her menstrual cycle.   Wt Readings from Last 3 Encounters: 12/24/21 : 165 lb 3.2 oz (74.9 kg) 09/09/21 : 166 lb 9.6 oz (75.6 kg) 06/10/21 : 171 lb 9.6 oz (77.8 kg)      Past Medical History:  Diagnosis Date   Asthma    Cervical dysplasia    Pregnancy induced hypertension    followed by dr Rogue Bussing (gyn)---- SVD 05-07-2020   Pulmonary embolism ([place])    Wears contact lenses      History reviewed. No pertinent family history.   Current Outpatient Medications:    amLODipine (NORVASC) 5 MG tablet, Take 1 tablet (5 mg total) by mouth daily., Disp: 90 tablet, Rfl: 1   fluticasone (FLONASE) 50 MCG/ACT nasal spray, Place 2 sprays into both nostrils daily., Disp: 16 g, Rfl: 2   No Known Allergies    The patient states she uses Nexplanon for  birth control.  Patient's last menstrual period was 12/21/2021. Irregular.  Negative for Dysmenorrhea and Negative for Menorrhagia. Negative for breast discharge, breast lump(s), breast pain and breast self exam. Associated symptoms include abnormal vaginal bleeding. Pertinent negatives include abnormal bleeding (hematology), anxiety, decreased libido, depression, difficulty falling sleep, dyspareunia, history of infertility, nocturia, sexual dysfunction, sleep disturbances, urinary incontinence, urinary urgency, vaginal discharge and vaginal itching. Diet regular; she has stopped eating pork and no meat. The patient states her exercise level is minimal.   The patient's tobacco use is:  Social History   Tobacco Use  Smoking Status Never  Smokeless Tobacco Never   She has been exposed to passive smoke. The patient's alcohol use is:  Social History   Substance and Sexual Activity  Alcohol Use Yes   Alcohol/week: 1.0 standard drink   Types: 1 Glasses of wine per week   Additional information: Last pap 11/19/2021, next one scheduled for 11/19/2022.    Review of Systems  Constitutional: Negative.   HENT: Negative.    Eyes: Negative.   Respiratory: Negative.    Cardiovascular: Negative.   Gastrointestinal: Negative.   Endocrine: Negative.   Genitourinary: Negative.   Musculoskeletal: Negative.   Skin: Negative.   Allergic/Immunologic: Negative.   Neurological: Negative.   Hematological: Negative.   Psychiatric/Behavioral: Negative.      Today's Vitals   12/24/21 1405  BP: 126/88  Pulse: 99  Temp: 98.6 F (37 C)  Weight: 165 lb 3.2 oz (74.9 kg)  Height: 5' 3.2" (1.605 m)  PainSc: 2   PainLoc: Head   Body mass index is 29.08 kg/m.   Objective:  Physical Exam Vitals reviewed.  Constitutional:      General: She is not in acute distress.    Appearance: Normal appearance. She is well-developed. She is obese.  HENT:     Head: Normocephalic and atraumatic.     Comments:  Temporal sinuses tender    Right Ear: Hearing, tympanic membrane, ear canal and external ear normal. There is no impacted cerumen.     Left Ear: Hearing, tympanic membrane, ear canal and external ear normal. There is no impacted cerumen.     Nose:     Comments: Deferred - masked    Mouth/Throat:     Comments: Deferred - masked Eyes:     General: Lids are normal.     Extraocular Movements: Extraocular movements intact.     Conjunctiva/sclera: Conjunctivae normal.     Pupils: Pupils are equal, round, and reactive to light.     Funduscopic exam:    Right eye: No papilledema.        Left eye: No papilledema.  Neck:     Thyroid: No thyroid mass.     Vascular: No carotid bruit.  Cardiovascular:     Rate and Rhythm: Normal rate and regular rhythm.     Pulses: Normal pulses.     Heart sounds: Normal heart sounds. No murmur heard. Pulmonary:     Effort: Pulmonary effort is normal.     Breath sounds: Normal breath sounds.  Chest:     Chest wall: No mass.  Breasts:    Tanner Score is 5.     Right: Normal. No mass or tenderness.     Left: Normal. No mass or tenderness.  Abdominal:     General: Abdomen is flat. Bowel sounds are normal. There is no distension.     Palpations: Abdomen is soft.     Tenderness: There is no abdominal tenderness.  Genitourinary:    Rectum: Guaiac result negative.  Musculoskeletal:        General: No swelling. Normal range of motion.     Cervical back: Full passive range of motion without pain, normal range of motion and neck supple.     Right lower leg: No edema.     Left lower leg: No edema.  Lymphadenopathy:     Upper Body:     Right upper body: No supraclavicular, axillary or pectoral adenopathy.     Left upper body: No supraclavicular, axillary or pectoral adenopathy.  Skin:    General: Skin is warm and dry.     Capillary Refill: Capillary refill takes less than 2 seconds.  Neurological:     General: No focal deficit present.     Mental Status:  She is alert and oriented to person, place, and time.     Cranial Nerves: No cranial nerve deficit.     Sensory: No sensory deficit.  Psychiatric:        Mood and Affect: Mood normal.        Behavior: Behavior normal.        Thought Content: Thought content normal.        Judgment: Judgment normal.        Assessment And Plan:     1. Encounter for general adult medical examination w/o abnormal  findings Behavior modifications discussed and diet history reviewed.   Pt will continue to exercise regularly and modify diet with low GI, plant based foods and decrease intake of processed foods.  Recommend intake of daily multivitamin, Vitamin D, and calcium.  Recommend for preventive screenings, as well as recommend immunizations that include influenza, TDAP Breast exam done with no abnormal findings.  2. Primary hypertension Comments: Blood pressure is well controlled. Continue current medications  - POCT Urinalysis Dipstick (81002) - POCT UA - Microalbumin - EKG 12-Lead - BMP8+eGFR  3. Snoring Comments: She has a history of snoring since having am headaches she needs to be evaluated for sleep apnea.  - Ambulatory referral to Sleep Studies  4. New daily persistent headache Comments: Temporal tenderness bilateral may be related to sinus congestion. Given Rx for flonase - fluticasone (FLONASE) 50 MCG/ACT nasal spray; Place 2 sprays into both nostrils daily.  Dispense: 16 g; Refill: 2 - CBC     Patient was given opportunity to ask questions. Patient verbalized understanding of the plan and was able to repeat key elements of the plan. All questions were answered to their satisfaction.   Minette Brine, FNP   I, Minette Brine, FNP, have reviewed all documentation for this visit. The documentation on 12/25/21 for the exam, diagnosis, procedures, and orders are all accurate and complete.  THE PATIENT IS ENCOURAGED TO PRACTICE SOCIAL DISTANCING DUE TO THE COVID-19 PANDEMIC.

## 2021-12-24 NOTE — Patient Instructions (Signed)

## 2021-12-25 LAB — BMP8+EGFR
BUN/Creatinine Ratio: 10 (ref 9–23)
BUN: 7 mg/dL (ref 6–20)
CO2: 23 mmol/L (ref 20–29)
Calcium: 9.2 mg/dL (ref 8.7–10.2)
Chloride: 103 mmol/L (ref 96–106)
Creatinine, Ser: 0.68 mg/dL (ref 0.57–1.00)
Glucose: 76 mg/dL (ref 70–99)
Potassium: 4.5 mmol/L (ref 3.5–5.2)
Sodium: 140 mmol/L (ref 134–144)
eGFR: 121 mL/min/{1.73_m2} (ref >59)

## 2021-12-25 LAB — CBC
Hematocrit: 37.4 % (ref 34.0–46.6)
Hemoglobin: 13.3 g/dL (ref 11.1–15.9)
MCH: 32.2 pg (ref 26.6–33.0)
MCHC: 35.6 g/dL (ref 31.5–35.7)
MCV: 91 fL (ref 79–97)
Platelets: 243 10*3/uL (ref 150–450)
RBC: 4.13 x10E6/uL (ref 3.77–5.28)
RDW: 12.3 % (ref 11.7–15.4)
WBC: 4.3 10*3/uL (ref 3.4–10.8)

## 2022-02-04 ENCOUNTER — Ambulatory Visit: Payer: Managed Care, Other (non HMO) | Admitting: Nurse Practitioner

## 2022-03-29 ENCOUNTER — Encounter: Payer: Self-pay | Admitting: Neurology

## 2022-03-29 ENCOUNTER — Institutional Professional Consult (permissible substitution): Payer: Managed Care, Other (non HMO) | Admitting: Neurology

## 2022-06-08 ENCOUNTER — Other Ambulatory Visit: Payer: Self-pay | Admitting: Nurse Practitioner

## 2022-06-08 DIAGNOSIS — I10 Essential (primary) hypertension: Secondary | ICD-10-CM

## 2022-06-10 ENCOUNTER — Encounter: Payer: Self-pay | Admitting: Nurse Practitioner

## 2022-06-10 ENCOUNTER — Other Ambulatory Visit: Payer: Self-pay

## 2022-06-10 ENCOUNTER — Ambulatory Visit (INDEPENDENT_AMBULATORY_CARE_PROVIDER_SITE_OTHER): Payer: Managed Care, Other (non HMO) | Admitting: Nurse Practitioner

## 2022-06-10 VITALS — BP 130/84 | HR 91 | Temp 98.3°F | Ht 63.2 in | Wt 172.0 lb

## 2022-06-10 DIAGNOSIS — R809 Proteinuria, unspecified: Secondary | ICD-10-CM | POA: Diagnosis not present

## 2022-06-10 DIAGNOSIS — E6609 Other obesity due to excess calories: Secondary | ICD-10-CM

## 2022-06-10 DIAGNOSIS — I10 Essential (primary) hypertension: Secondary | ICD-10-CM

## 2022-06-10 DIAGNOSIS — Z6830 Body mass index (BMI) 30.0-30.9, adult: Secondary | ICD-10-CM | POA: Diagnosis not present

## 2022-06-10 LAB — POCT URINALYSIS DIPSTICK
Bilirubin, UA: NEGATIVE
Glucose, UA: NEGATIVE
Ketones, UA: NEGATIVE
Leukocytes, UA: NEGATIVE
Nitrite, UA: NEGATIVE
Protein, UA: NEGATIVE
Spec Grav, UA: 1.025 (ref 1.010–1.025)
Urobilinogen, UA: 2 E.U./dL — AB
pH, UA: 7 (ref 5.0–8.0)

## 2022-06-10 MED ORDER — SCOPOLAMINE 1 MG/3DAYS TD PT72: 1.0000 | MEDICATED_PATCH | TRANSDERMAL | 0 refills | Status: AC

## 2022-06-10 MED ORDER — AMLODIPINE BESYLATE 5 MG PO TABS: 5.0000 mg | ORAL_TABLET | Freq: Every day | ORAL | 1 refills | Status: DC

## 2022-06-10 MED ORDER — SCOPOLAMINE 1 MG/3DAYS TD PT72: 1.0000 | MEDICATED_PATCH | TRANSDERMAL | 0 refills | Status: DC

## 2022-06-10 NOTE — Progress Notes (Signed)
Jeri Cos Llittleton,acting as a Neurosurgeon for Arnette Felts, FNP.,have documented all relevant documentation on the behalf of Arnette Felts, FNP,as directed by  Arnette Felts, FNP while in the presence of Arnette Felts, FNP.   This visit occurred during the SARS-CoV-2 public health emergency.  Safety protocols were in place, including screening questions prior to the visit, additional usage of staff PPE, and extensive cleaning of exam room while observing appropriate contact time as indicated for disinfecting solutions.  Subjective:     Patient ID: Laura Sutton , female    DOB: January 04, 1991 , 31 y.o.   MRN: [id number]   Chief Complaint  Patient presents with   Hypertension    HPI  Patient is here for HTN follow up. Tolerating medications well. She has been avoiding high salt foods.   Wt Readings from Last 3 Encounters: 06/10/22 : 172 lb (78 kg) 12/24/21 : 165 lb 3.2 oz (74.9 kg) 09/09/21 : 166 lb 9.6 oz (75.6 kg)        Hypertension This is a chronic problem. The current episode started more than 1 year ago. The problem is controlled. Pertinent negatives include no anxiety, chest pain or palpitations. Risk factors for coronary artery disease include sedentary lifestyle and obesity. Past treatments include calcium channel blockers.     Past Medical History:  Diagnosis Date   Asthma    Cervical dysplasia    Pregnancy induced hypertension    followed by dr Claiborne Billings (gyn)---- SVD 05-07-2020   Pulmonary embolism ([facility])    Wears contact lenses      History reviewed. No pertinent family history.   Current Outpatient Medications:    fluticasone (FLONASE) 50 MCG/ACT nasal spray, Place 2 sprays into both nostrils daily., Disp: 16 g, Rfl: 2   amLODipine (NORVASC) 5 MG tablet, Take 1 tablet (5 mg total) by mouth daily., Disp: 90 tablet, Rfl: 1   scopolamine (TRANSDERM-SCOP) 1 MG/3DAYS, Place 1 patch (1.5 mg total) onto the skin every 3 (three) days., Disp: 4 patch, Rfl: 0   No  Known Allergies   Review of Systems  Constitutional: Negative.   Respiratory: Negative.    Cardiovascular: Negative.  Negative for chest pain and palpitations.  Neurological: Negative.   Psychiatric/Behavioral: Negative.       Today's Vitals   06/10/22 1543  BP: 130/84  Pulse: 91  Temp: 98.3 F (36.8 C)  Weight: 172 lb (78 kg)  Height: 5' 3.2" (1.605 m)  PainSc: 0-No pain   Body mass index is 30.28 kg/m.  Wt Readings from Last 3 Encounters:  06/10/22 172 lb (78 kg)  12/24/21 165 lb 3.2 oz (74.9 kg)  09/09/21 166 lb 9.6 oz (75.6 kg)     Objective:  Physical Exam Vitals reviewed.  Constitutional:      General: She is not in acute distress.    Appearance: Normal appearance. She is well-developed. She is obese.  HENT:     Head: Normocephalic and atraumatic.  Eyes:     Pupils: Pupils are equal, round, and reactive to light.  Cardiovascular:     Rate and Rhythm: Normal rate and regular rhythm.     Pulses: Normal pulses.     Heart sounds: Normal heart sounds. No murmur heard. Pulmonary:     Effort: Pulmonary effort is normal. No respiratory distress.     Breath sounds: Normal breath sounds. No wheezing.  Musculoskeletal:        General: Normal range of motion.  Skin:    General: Skin is  warm and dry.     Capillary Refill: Capillary refill takes less than 2 seconds.  Neurological:     General: No focal deficit present.     Mental Status: She is alert and oriented to person, place, and time.     Cranial Nerves: No cranial nerve deficit.  Psychiatric:        Mood and Affect: Mood normal.        Behavior: Behavior normal.        Thought Content: Thought content normal.        Judgment: Judgment normal.         Assessment And Plan:     1. Primary hypertension Comments: Blood pressure is fairly controlled, continue current medications  2. Proteinuria, unspecified type Comments: Repeat urinalysis is negative for protein, hematuria however her menstruation is  on. - POCT Urinalysis Dipstick (81002)  3. Class 1 obesity due to excess calories with body mass index (BMI) of 30.0 to 30.9 in adult, unspecified whether serious comorbidity present Chronic Discussed healthy diet and regular exercise options  Encouraged to exercise at least 150 minutes per week with 2 days of strength training She is encouraged to strive for BMI less than 30 to decrease cardiac risk.    Patient was given opportunity to ask questions. Patient verbalized understanding of the plan and was able to repeat key elements of the plan. All questions were answered to their satisfaction.  Arnette Felts, FNP   I, Arnette Felts, FNP, have reviewed all documentation for this visit. The documentation on 06/10/22 for the exam, diagnosis, procedures, and orders are all accurate and complete.   IF YOU HAVE BEEN REFERRED TO A SPECIALIST, IT MAY TAKE 1-2 WEEKS TO SCHEDULE/PROCESS THE REFERRAL. IF YOU HAVE NOT HEARD FROM US/SPECIALIST IN TWO WEEKS, PLEASE GIVE [place] A CALL AT [phone].   THE PATIENT IS ENCOURAGED TO PRACTICE SOCIAL DISTANCING DUE TO THE COVID-19 PANDEMIC.

## 2022-07-30 IMAGING — CT CT ANGIO CHEST
2 of 8 series · 19 of 36 positions shown · IV contrast (omnipaque)
Comparison: Chest x-ray 11/15/2020

CLINICAL DATA: Shortness of breath while lying flat and with
exertion. Slight cough. Suspected pulmonary embolus.

EXAM:
CT ANGIOGRAPHY CHEST WITH CONTRAST
TECHNIQUE: Multidetector CT imaging of the chest was performed using the
standard protocol during bolus administration of intravenous
contrast. Multiplanar CT image reconstructions and MIPs were
obtained to evaluate the vascular anatomy.
CONTRAST:  64mL OMNIPAQUE IOHEXOL 350 MG/ML SOLN

[Series 6: pe thins · axial · 0.69mm/px · z∈[-226,+12]mm · 18 of 267 slices shown]
[im 15/267  lung]
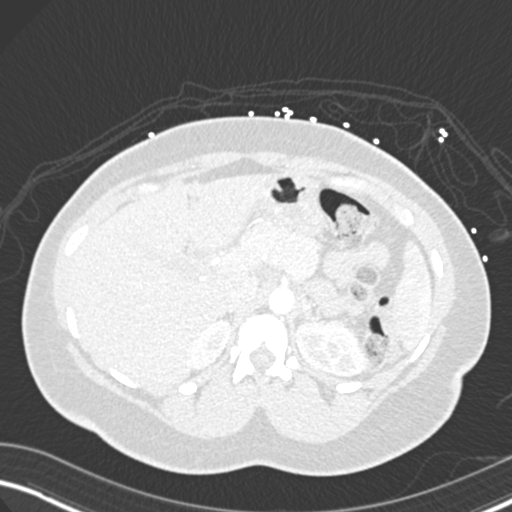
[im 29/267  mediastinal]
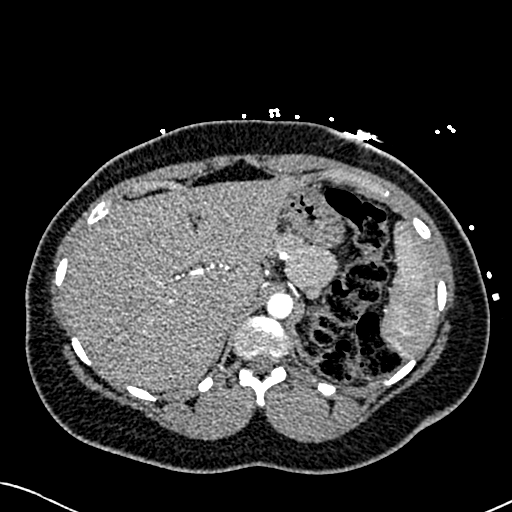
[im 43/267  lung]
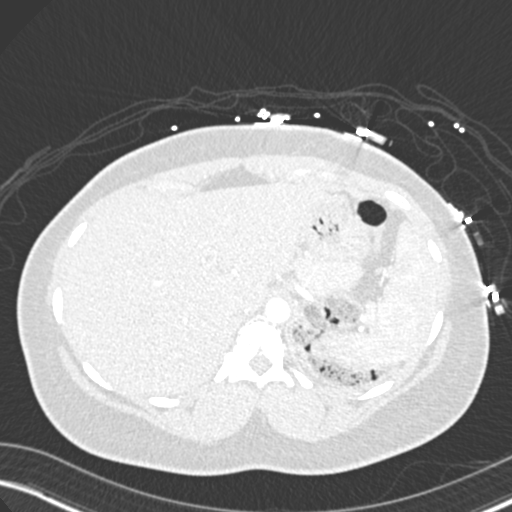
[im 57/267  mediastinal]
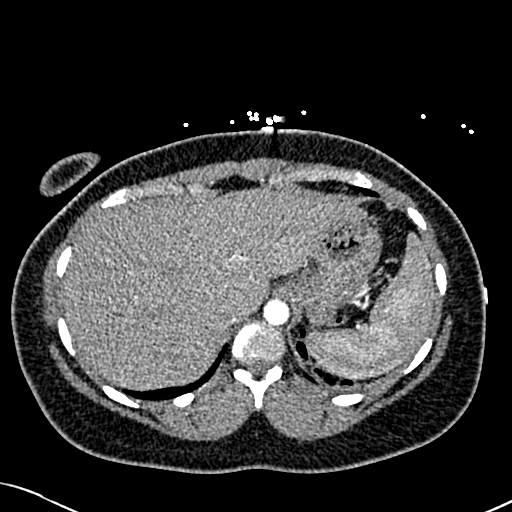
[im 71/267  lung]
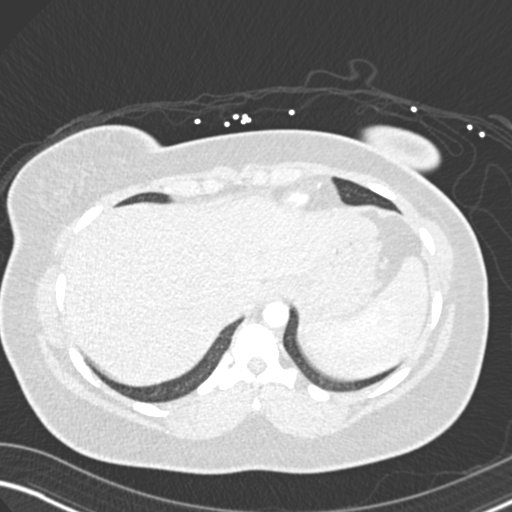
[im 85/267  mediastinal]
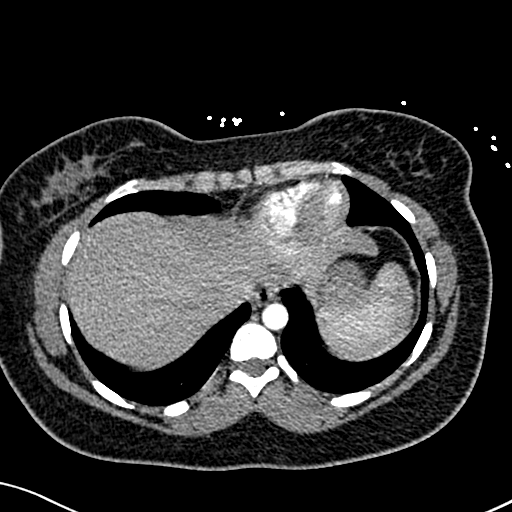
[im 99/267  lung]
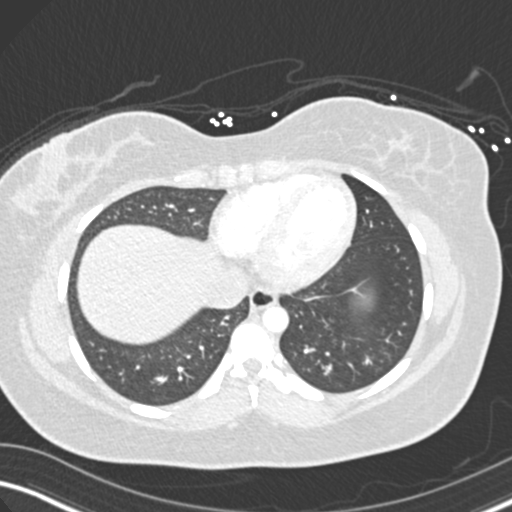
[im 113/267  mediastinal]
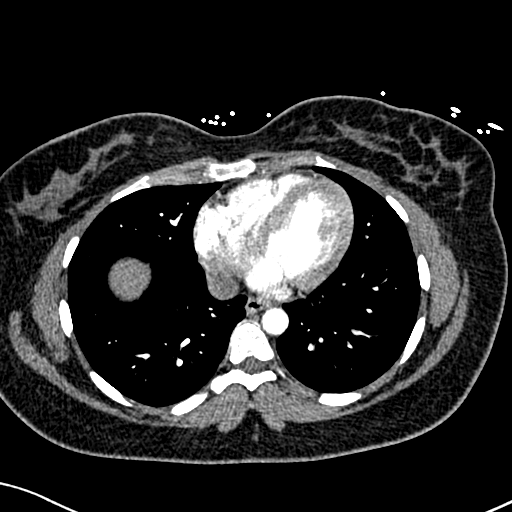
[im 127/267  lung]
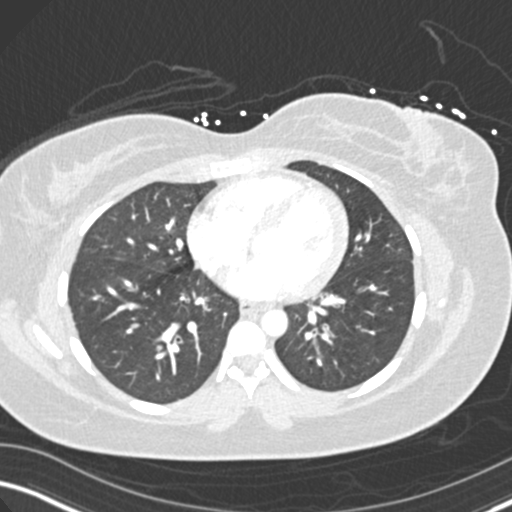
[im 141/267  mediastinal]
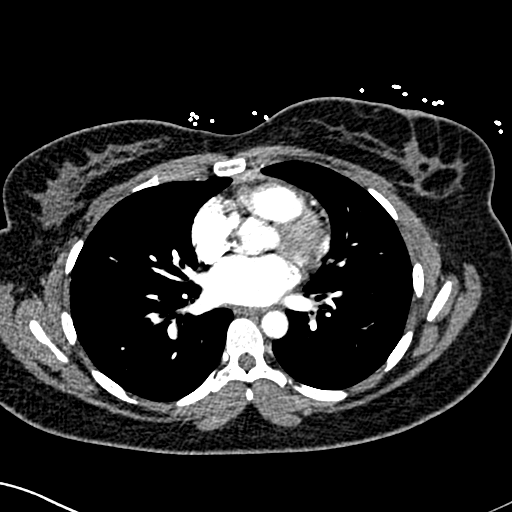
[im 155/267  lung]
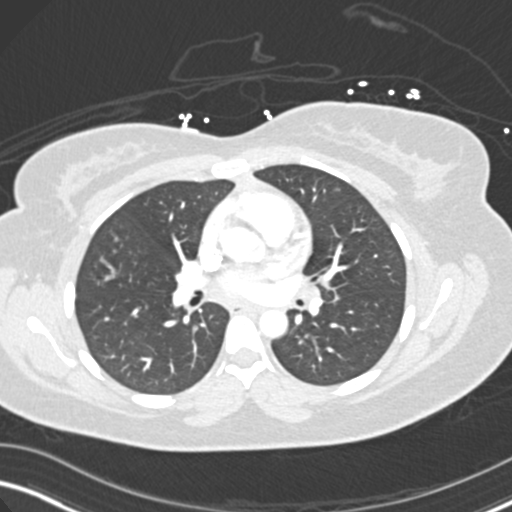
[im 169/267  mediastinal]
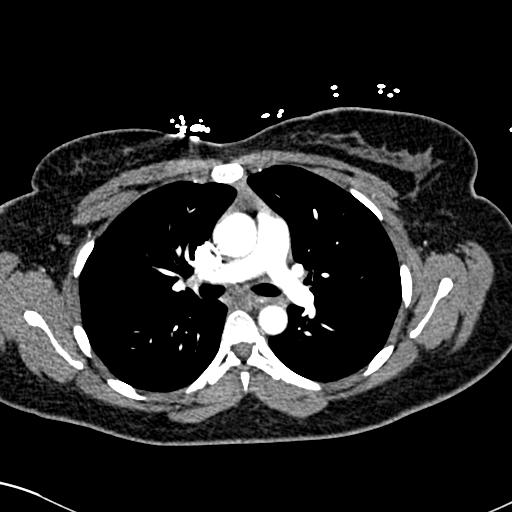
[im 183/267  lung]
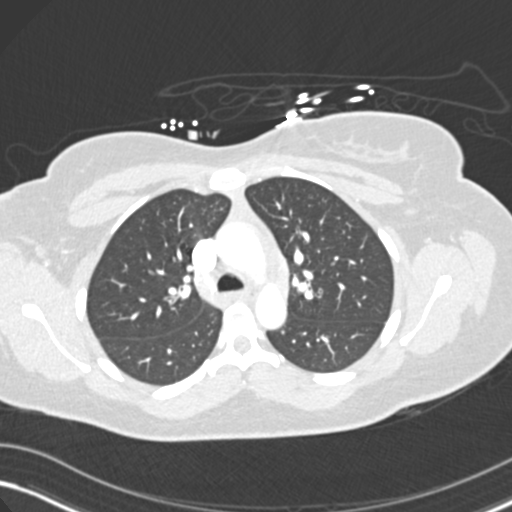
[im 197/267  mediastinal]
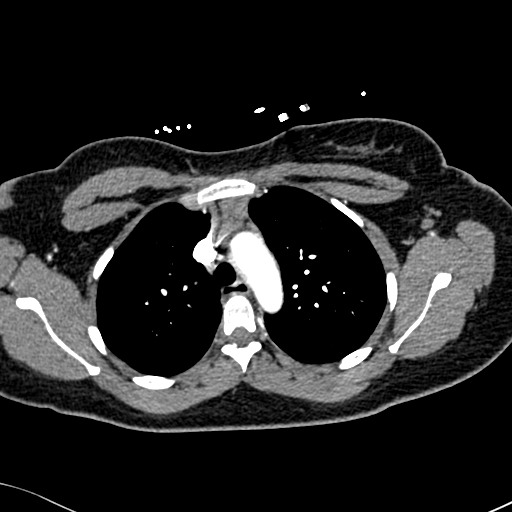
[im 211/267  lung]
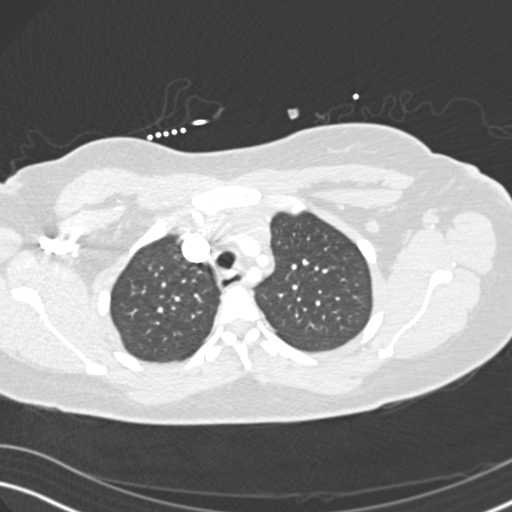
[im 225/267  mediastinal]
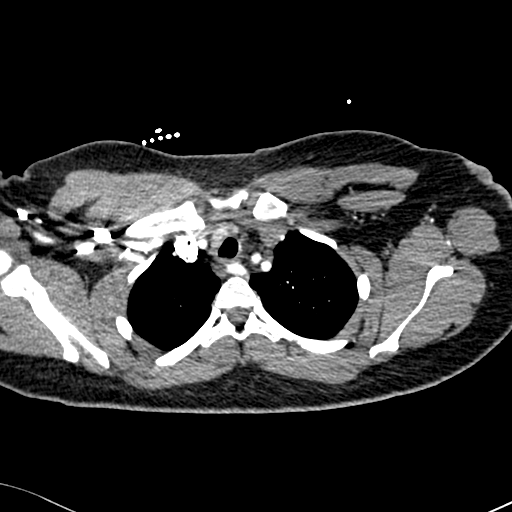
[im 239/267  lung]
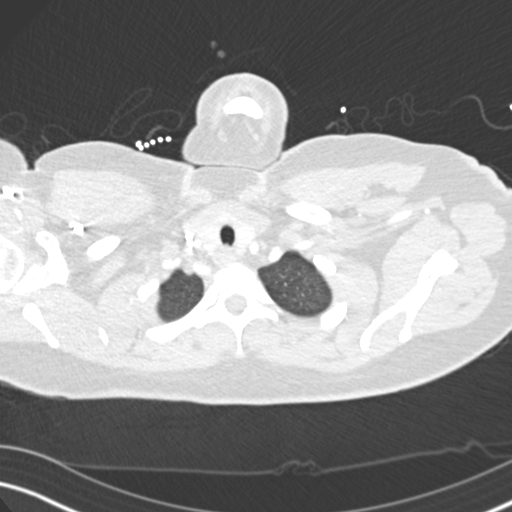
[im 253/267  mediastinal]
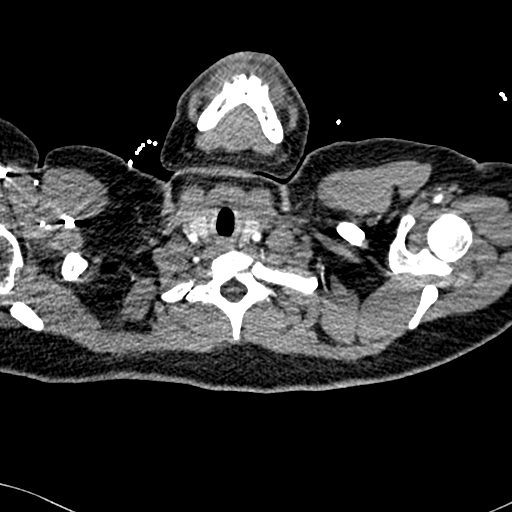

[Series 7: pe coronal mpr · coronal · 0.53mm/px · 1 of 136 slices shown]
[im 68/136  mediastinal]
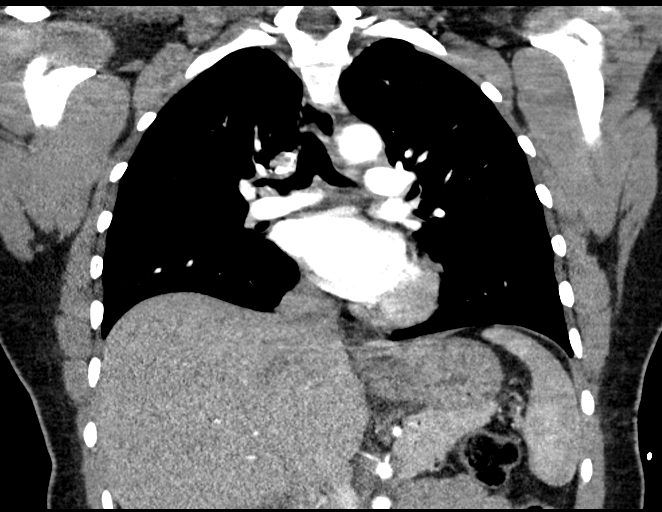

[19 of 36 positions shown; findings below may reference images not displayed]

FINDINGS: Cardiovascular: Satisfactory opacification of the pulmonary arteries
to the segmental level. No evidence of pulmonary embolism. The main
pulmonary artery is normal in caliber.

Normal heart size. No pericardial effusion. The thoracic aorta is
normal in caliber. Incidentally noted aberrant right subclavian
artery.

Mediastinum/Nodes: Trace soft tissue density within the anterior
mediastinum likely related to residual thymus tissue. No enlarged
mediastinal, hilar, or axillary lymph nodes. Thyroid gland, trachea,
and esophagus demonstrate no significant findings.

Lungs/Pleura: No focal consolidation. Calcified pulmonary
micronodule within the left lower lobe ([DATE]). No pulmonary mass. No
pleural effusion. No pneumothorax.

Upper Abdomen: No acute abnormality.

Musculoskeletal:

No chest wall abnormality

No suspicious lytic or blastic osseous lesions. No acute displaced
fracture. Multilevel degenerative changes of the spine.

Review of the MIP images confirms the above findings.
IMPRESSION: 1. No pulmonary embolus.
2. No acute intrathoracic abnormality.

## 2022-10-12 ENCOUNTER — Ambulatory Visit (INDEPENDENT_AMBULATORY_CARE_PROVIDER_SITE_OTHER): Payer: Managed Care, Other (non HMO) | Admitting: Nurse Practitioner

## 2022-10-12 ENCOUNTER — Encounter: Payer: Self-pay | Admitting: Nurse Practitioner

## 2022-10-12 VITALS — BP 110/80 | HR 89 | Temp 98.1°F | Ht 63.0 in | Wt 176.3 lb

## 2022-10-12 DIAGNOSIS — E6609 Other obesity due to excess calories: Secondary | ICD-10-CM

## 2022-10-12 DIAGNOSIS — Z2821 Immunization not carried out because of patient refusal: Secondary | ICD-10-CM | POA: Diagnosis not present

## 2022-10-12 DIAGNOSIS — Z6831 Body mass index (BMI) 31.0-31.9, adult: Secondary | ICD-10-CM | POA: Diagnosis not present

## 2022-10-12 DIAGNOSIS — E66811 Obesity, class 1: Secondary | ICD-10-CM

## 2022-10-12 DIAGNOSIS — I10 Essential (primary) hypertension: Secondary | ICD-10-CM | POA: Diagnosis not present

## 2022-10-12 DIAGNOSIS — R809 Proteinuria, unspecified: Secondary | ICD-10-CM

## 2022-10-12 NOTE — Progress Notes (Signed)
I,Victoria T Hamilton,acting as a Education administrator for Minette Brine, FNP.,have documented all relevant documentation on the behalf of Minette Brine, FNP,as directed by  Minette Brine, FNP while in the presence of Minette Brine, [place].    Subjective:     Patient ID: Laura Sutton , female    DOB: September 05, 1991 , 31 y.o.   MRN: [id number]   Chief Complaint  Patient presents with   Hypertension    HPI  Patient is here for HTN follow up. Tolerating medications well. She has been avoiding high salt foods. She admits to not exercising regularly  Denies headache, chest pain, SOB, blurred vision.     Hypertension This is a chronic problem. The current episode started more than 1 year ago. The problem is controlled. Pertinent negatives include no anxiety, chest pain or palpitations. Risk factors for coronary artery disease include sedentary lifestyle and obesity. Past treatments include calcium channel blockers.     Past Medical History:  Diagnosis Date   Asthma    Cervical dysplasia    Pregnancy induced hypertension    followed by dr Rogue Bussing (gyn)---- SVD 05-07-2020   Pulmonary embolism ([place])    Wears contact lenses      History reviewed. No pertinent family history.   Current Outpatient Medications:    amLODipine (NORVASC) 5 MG tablet, Take 1 tablet (5 mg total) by mouth daily., Disp: 90 tablet, Rfl: 1   fluticasone (FLONASE) 50 MCG/ACT nasal spray, Place 2 sprays into both nostrils daily. (Patient not taking: Reported on 10/12/2022), Disp: 16 g, Rfl: 2   scopolamine (TRANSDERM-SCOP) 1 MG/3DAYS, Place 1 patch (1.5 mg total) onto the skin every 3 (three) days. (Patient not taking: Reported on 10/12/2022), Disp: 4 patch, Rfl: 0   No Known Allergies   Review of Systems  Constitutional: Negative.   Respiratory: Negative.    Cardiovascular: Negative.  Negative for chest pain and palpitations.  Neurological: Negative.   Psychiatric/Behavioral: Negative.       Today's Vitals   10/12/22  1509  BP: 110/80  Pulse: 89  Temp: 98.1 F (36.7 C)  SpO2: 98%  Weight: 176 lb 4.8 oz (80 kg)  Height: 5' 3"  (1.6 m)  PainSc: 0-No pain   Body mass index is 31.23 kg/m.  Wt Readings from Last 3 Encounters:  10/12/22 176 lb 4.8 oz (80 kg)  06/10/22 172 lb (78 kg)  12/24/21 165 lb 3.2 oz (74.9 kg)    Objective:  Physical Exam Vitals reviewed.  Constitutional:      General: She is not in acute distress.    Appearance: Normal appearance. She is well-developed. She is obese.  HENT:     Head: Normocephalic and atraumatic.  Eyes:     Pupils: Pupils are equal, round, and reactive to light.  Cardiovascular:     Rate and Rhythm: Normal rate and regular rhythm.     Pulses: Normal pulses.     Heart sounds: Normal heart sounds. No murmur heard. Pulmonary:     Effort: Pulmonary effort is normal. No respiratory distress.     Breath sounds: Normal breath sounds. No wheezing.  Musculoskeletal:        General: Normal range of motion.  Skin:    General: Skin is warm and dry.     Capillary Refill: Capillary refill takes less than 2 seconds.  Neurological:     General: No focal deficit present.     Mental Status: She is alert and oriented to person, place, and time.  Cranial Nerves: No cranial nerve deficit.  Psychiatric:        Mood and Affect: Mood normal.        Behavior: Behavior normal.        Thought Content: Thought content normal.        Judgment: Judgment normal.         Assessment And Plan:     1. Primary hypertension Comments: Blood pressure is fairly controlled, continue current medications and focusing on weight loss. - CMP14+EGFR  2. Influenza vaccination declined Patient declined influenza vaccination at this time. Patient is aware that influenza vaccine prevents illness in 70% of healthy people, and reduces hospitalizations to 30-70% in elderly. This vaccine is recommended annually. Education has been provided regarding the importance of this vaccine but  patient still declined. Advised may receive this vaccine at local pharmacy or Health Dept.or vaccine clinic. Aware to provide a copy of the vaccination record if obtained from local pharmacy or Health Dept.  Pt is willing to accept risk associated with refusing vaccination.  3. Class 1 obesity due to excess calories without serious comorbidity with body mass index (BMI) of 31.0 to 31.9 in adult She is encouraged to strive for BMI less than 30 to decrease cardiac risk. Advised to aim for at least 150 minutes of exercise per week.    Patient was given opportunity to ask questions. Patient verbalized understanding of the plan and was able to repeat key elements of the plan. All questions were answered to their satisfaction.  Minette Brine, FNP   I, Minette Brine, FNP, have reviewed all documentation for this visit. The documentation on 10/12/22 for the exam, diagnosis, procedures, and orders are all accurate and complete.   IF YOU HAVE BEEN REFERRED TO A SPECIALIST, IT MAY TAKE 1-2 WEEKS TO SCHEDULE/PROCESS THE REFERRAL. IF YOU HAVE NOT HEARD FROM US/SPECIALIST IN TWO WEEKS, PLEASE GIVE [place] A CALL AT [phone].   THE PATIENT IS ENCOURAGED TO PRACTICE SOCIAL DISTANCING DUE TO THE COVID-19 PANDEMIC.

## 2022-10-12 NOTE — Patient Instructions (Addendum)
Hypertension, Adult ?Hypertension is another name for high blood pressure. High blood pressure forces your heart to work harder to pump blood. This can cause problems over time. ?There are two numbers in a blood pressure reading. There is a top number (systolic) over a bottom number (diastolic). It is best to have a blood pressure that is below 120/80. ?What are the causes? ?The cause of this condition is not known. Some other conditions can lead to high blood pressure. ?What increases the risk? ?Some lifestyle factors can make you more likely to develop high blood pressure: ?Smoking. ?Not getting enough exercise or physical activity. ?Being overweight. ?Having too much fat, sugar, calories, or salt (sodium) in your diet. ?Drinking too much alcohol. ?Other risk factors include: ?Having any of these conditions: ?Heart disease. ?Diabetes. ?High cholesterol. ?Kidney disease. ?Obstructive sleep apnea. ?Having a family history of high blood pressure and high cholesterol. ?Age. The risk increases with age. ?Stress. ?What are the signs or symptoms? ?High blood pressure may not cause symptoms. Very high blood pressure (hypertensive crisis) may cause: ?Headache. ?Fast or uneven heartbeats (palpitations). ?Shortness of breath. ?Nosebleed. ?Vomiting or feeling like you may vomit (nauseous). ?Changes in how you see. ?Very bad chest pain. ?Feeling dizzy. ?Seizures. ?How is this treated? ?This condition is treated by making healthy lifestyle changes, such as: ?Eating healthy foods. ?Exercising more. ?Drinking less alcohol. ?Your doctor may prescribe medicine if lifestyle changes do not help enough and if: ?Your top number is above 130. ?Your bottom number is above 80. ?Your personal target blood pressure may vary. ?Follow these instructions at home: ?Eating and drinking ? ?If told, follow the DASH eating plan. To follow this plan: ?Fill one half of your plate at each meal with fruits and vegetables. ?Fill one fourth of your plate  at each meal with whole grains. Whole grains include whole-wheat pasta, brown rice, and whole-grain bread. ?Eat or drink low-fat dairy products, such as skim milk or low-fat yogurt. ?Fill one fourth of your plate at each meal with low-fat (lean) proteins. Low-fat proteins include fish, chicken without skin, eggs, beans, and tofu. ?Avoid fatty meat, cured and processed meat, or chicken with skin. ?Avoid pre-made or processed food. ?Limit the amount of salt in your diet to less than 1,500 mg each day. ?Do not drink alcohol if: ?Your doctor tells you not to drink. ?You are pregnant, may be pregnant, or are planning to become pregnant. ?If you drink alcohol: ?Limit how much you have to: ?0-1 drink a day for women. ?0-2 drinks a day for men. ?Know how much alcohol is in your drink. In the [place] one drink equals one 12 oz bottle of beer (355 mL), one 5 oz glass of wine (148 mL), or one 1? oz glass of hard liquor (44 mL). ?Lifestyle ? ?Work with your doctor to stay at a healthy weight or to lose weight. Ask your doctor what the best weight is for you. ?Get at least 30 minutes of exercise that causes your heart to beat faster (aerobic exercise) most days of the week. This may include walking, swimming, or biking. ?Get at least 30 minutes of exercise that strengthens your muscles (resistance exercise) at least 3 days a week. This may include lifting weights or doing Pilates. ?Do not smoke or use any products that contain nicotine or tobacco. If you need help quitting, ask your doctor. ?Check your blood pressure at home as told by your doctor. ?Keep all follow-up visits. ?Medicines ?Take over-the-counter and prescription medicines   only as told by your doctor. Follow directions carefully. ?Do not skip doses of blood pressure medicine. The medicine does not work as well if you skip doses. Skipping doses also puts you at risk for problems. ?Ask your doctor about side effects or reactions to medicines that you should watch  for. ?Contact a doctor if: ?You think you are having a reaction to the medicine you are taking. ?You have headaches that keep coming back. ?You feel dizzy. ?You have swelling in your ankles. ?You have trouble with your vision. ?Get help right away if: ?You get a very bad headache. ?You start to feel mixed up (confused). ?You feel weak or numb. ?You feel faint. ?You have very bad pain in your: ?Chest. ?Belly (abdomen). ?You vomit more than once. ?You have trouble breathing. ?These symptoms may be an emergency. Get help right away. Call 911. ?Do not wait to see if the symptoms will go away. ?Do not drive yourself to the hospital. ?Summary ?Hypertension is another name for high blood pressure. ?High blood pressure forces your heart to work harder to pump blood. ?For most people, a normal blood pressure is less than 120/80. ?Making healthy choices can help lower blood pressure. If your blood pressure does not get lower with healthy choices, you may need to take medicine. ?This information is not intended to replace advice given to you by your health care provider. Make sure you discuss any questions you have with your health care provider. ?Document Revised: 09/24/2021 Document Reviewed: 09/24/2021 ?Elsevier Patient Education ? 2023 [employer] ? ?

## 2022-10-13 LAB — CMP14+EGFR
ALT: 12 IU/L (ref 0–32)
AST: 15 IU/L (ref 0–40)
Albumin/Globulin Ratio: 1.6 (ref 1.2–2.2)
Albumin: 4.2 g/dL (ref 4.0–5.0)
Alkaline Phosphatase: 58 IU/L (ref 44–121)
BUN/Creatinine Ratio: 9 (ref 9–23)
BUN: 6 mg/dL (ref 6–20)
Bilirubin Total: 0.3 mg/dL (ref 0.0–1.2)
CO2: 24 mmol/L (ref 20–29)
Calcium: 9.1 mg/dL (ref 8.7–10.2)
Chloride: 103 mmol/L (ref 96–106)
Creatinine, Ser: 0.68 mg/dL (ref 0.57–1.00)
Globulin, Total: 2.6 g/dL (ref 1.5–4.5)
Glucose: 82 mg/dL (ref 70–99)
Potassium: 4 mmol/L (ref 3.5–5.2)
Sodium: 139 mmol/L (ref 134–144)
Total Protein: 6.8 g/dL (ref 6.0–8.5)
eGFR: 120 mL/min/{1.73_m2} (ref >59)

## 2023-01-20 ENCOUNTER — Encounter: Payer: Self-pay | Admitting: Nurse Practitioner

## 2023-03-03 ENCOUNTER — Encounter: Payer: Self-pay | Admitting: Nurse Practitioner

## 2023-03-03 ENCOUNTER — Ambulatory Visit (INDEPENDENT_AMBULATORY_CARE_PROVIDER_SITE_OTHER): Payer: 59 | Admitting: Nurse Practitioner

## 2023-03-03 VITALS — BP 130/80 | HR 100 | Temp 98.6°F | Ht 63.0 in | Wt 179.0 lb

## 2023-03-03 DIAGNOSIS — Z6831 Body mass index (BMI) 31.0-31.9, adult: Secondary | ICD-10-CM | POA: Diagnosis not present

## 2023-03-03 DIAGNOSIS — Z0000 Encounter for general adult medical examination without abnormal findings: Secondary | ICD-10-CM

## 2023-03-03 DIAGNOSIS — Z2821 Immunization not carried out because of patient refusal: Secondary | ICD-10-CM

## 2023-03-03 DIAGNOSIS — R221 Localized swelling, mass and lump, neck: Secondary | ICD-10-CM

## 2023-03-03 DIAGNOSIS — I10 Essential (primary) hypertension: Secondary | ICD-10-CM | POA: Insufficient documentation

## 2023-03-03 DIAGNOSIS — E6609 Other obesity due to excess calories: Secondary | ICD-10-CM

## 2023-03-03 DIAGNOSIS — Z13220 Encounter for screening for lipoid disorders: Secondary | ICD-10-CM

## 2023-03-03 DIAGNOSIS — Z79899 Other long term (current) drug therapy: Secondary | ICD-10-CM

## 2023-03-03 LAB — POCT URINALYSIS DIPSTICK
Bilirubin, UA: NEGATIVE
Blood, UA: NEGATIVE
Glucose, UA: NEGATIVE
Ketones, UA: NEGATIVE
Leukocytes, UA: NEGATIVE
Nitrite, UA: NEGATIVE
Protein, UA: NEGATIVE
Spec Grav, UA: >=1.03 — AB (ref 1.010–1.025)
Urobilinogen, UA: 1 E.U./dL
pH, UA: 6 (ref 5.0–8.0)

## 2023-03-03 NOTE — Progress Notes (Signed)
Note above

## 2023-03-03 NOTE — Patient Instructions (Signed)

## 2023-03-03 NOTE — Progress Notes (Signed)
Subjective:     Patient ID: Laura Sutton , female    DOB: 22-Oct-1991 , 32 y.o.   MRN: [id number]   Chief Complaint  Patient presents with   Annual Exam    HPI  Patient present today  for annual physical and blood prressure chek. Patient denies adverse reactions to bp. Patient [place] patient has irregular period. Patient describes her menstrual cycle as slightly painful painful and she bleeds up to 10 days occurring within the last few months Oct or Nov. Patient Nexplanon will be replaced in Aug. LMP- 02/03/2023. Patient suffered from headaches from previous sinus infection and use flonase and patient experiences relief. Patient incorporates fruits and vegetables in regular diet. Patient admits to not working out as much due to children, work and family. She admits to overeating due to boredome.   Wt Readings from Last 3 Encounters: 03/03/23 : 179 lb (81.2 kg) 10/12/22 : 176 lb 4.8 oz (80 kg) 06/10/22 : 172 lb (78 kg)  She drinks approximately 3-4 (16oz) bottles of water a day.     Past Medical History:  Diagnosis Date   Asthma    Cervical dysplasia    Pregnancy induced hypertension    followed by dr Rogue Bussing (gyn)---- SVD 05-07-2020   Pulmonary embolism ([place])    Wears contact lenses      History reviewed. No pertinent family history.   Current Outpatient Medications:    amLODipine (NORVASC) 5 MG tablet, Take 1 tablet (5 mg total) by mouth daily., Disp: 90 tablet, Rfl: 1   etonogestrel (NEXPLANON) 68 MG IMPL implant, 1 each by Subdermal route once., Disp: , Rfl:    fluticasone (FLONASE) 50 MCG/ACT nasal spray, Place 2 sprays into both nostrils daily. (Patient not taking: Reported on 10/12/2022), Disp: 16 g, Rfl: 2   scopolamine (TRANSDERM-SCOP) 1 MG/3DAYS, Place 1 patch (1.5 mg total) onto the skin every 3 (three) days. (Patient not taking: Reported on 10/12/2022), Disp: 4 patch, Rfl: 0   No Known Allergies   Review of Systems  Constitutional: Negative.   HENT:  Negative.    Eyes: Negative.   Respiratory: Negative.    Cardiovascular: Negative.   Gastrointestinal: Negative.   Endocrine: Negative.   Genitourinary: Negative.   Musculoskeletal: Negative.   Skin: Negative.   Allergic/Immunologic: Negative.   Neurological: Negative.   Hematological: Negative.   Psychiatric/Behavioral: Negative.       Today's Vitals   03/03/23 1552 03/03/23 1606  BP: (!) 140/80 130/80  Pulse: 100   Temp: 98.6 F (37 C)   TempSrc: Oral   Weight: 179 lb (81.2 kg)   Height: 5\' 3"  (1.6 m)   PainSc: 0-No pain    Body mass index is 31.71 kg/m.   Objective:  Physical Exam Vitals reviewed.  Constitutional:      General: She is not in acute distress.    Appearance: Normal appearance. She is well-developed. She is obese.  HENT:     Head: Normocephalic and atraumatic.     Right Ear: Hearing, tympanic membrane, ear canal and external ear normal. There is no impacted cerumen.     Left Ear: Hearing, tympanic membrane, ear canal and external ear normal. There is no impacted cerumen.     Nose:     Comments: Deferred - masked    Mouth/Throat:     Comments: Deferred - masked Eyes:     General: Lids are normal.     Extraocular Movements: Extraocular movements intact.     Conjunctiva/sclera: Conjunctivae  normal.     Pupils: Pupils are equal, round, and reactive to light.     Funduscopic exam:    Right eye: No papilledema.        Left eye: No papilledema.  Neck:     Thyroid: No thyroid mass.     Vascular: No carotid bruit.     Comments: Neck fullness Cardiovascular:     Rate and Rhythm: Normal rate and regular rhythm.     Pulses: Normal pulses.     Heart sounds: Normal heart sounds. No murmur heard. Pulmonary:     Effort: Pulmonary effort is normal. No respiratory distress.     Breath sounds: Normal breath sounds. No wheezing.  Chest:     Chest wall: No mass.  Breasts:    Tanner Score is 5.     Right: Normal. No mass or tenderness.     Left: Normal. No  mass or tenderness.  Abdominal:     General: Abdomen is flat. Bowel sounds are normal. There is no distension.     Palpations: Abdomen is soft.     Tenderness: There is no abdominal tenderness.  Musculoskeletal:        General: No swelling. Normal range of motion.     Cervical back: Full passive range of motion without pain, normal range of motion and neck supple. No edema.     Right lower leg: No edema.     Left lower leg: No edema.  Lymphadenopathy:     Upper Body:     Right upper body: No supraclavicular, axillary or pectoral adenopathy.     Left upper body: No supraclavicular, axillary or pectoral adenopathy.  Skin:    General: Skin is warm and dry.     Capillary Refill: Capillary refill takes less than 2 seconds.  Neurological:     General: No focal deficit present.     Mental Status: She is alert and oriented to person, place, and time.     Cranial Nerves: No cranial nerve deficit.     Sensory: No sensory deficit.  Psychiatric:        Mood and Affect: Mood normal.        Behavior: Behavior normal.        Thought Content: Thought content normal.        Judgment: Judgment normal.         Assessment And Plan:     1. Annual physical exam Behavior modifications discussed and diet history reviewed.   Pt will continue to exercise regularly and modify diet with low GI, plant based foods and decrease intake of processed foods.  Recommend intake of daily multivitamin, Vitamin D, and calcium.  Recommend self breast exams for preventive screenings, as well as recommend immunizations that include influenza, TDAP (declined)  2. Tetanus, diphtheria, and acellular pertussis (Tdap) vaccination declined  3. Encounter for screening for lipid disorder - Lipid panel  4. Class 1 obesity due to excess calories with body mass index (BMI) of 31.0 to 31.9 in adult, unspecified whether serious comorbidity present She is encouraged to strive for BMI less than 30 to decrease cardiac risk.  Advised to aim for at least 150 minutes of exercise per week. - Hemoglobin A1c  5. Other long term (current) drug therapy - CBC  6. Primary hypertension Comments: Blood pressure is fairly controlled, continue current medications. EKG done with NSR HR 80 - EKG 12-Lead - Microalbumin / creatinine urine ratio - POCT urinalysis dipstick - CMP14+EGFR  7. Neck  fullness Comments: No nodules palpated however has neck fullness. Will check thyroid /neck ultrasound - US THYROID; Future     Patient was given opportunity to ask questions. Patient verbalized understanding of the plan and was able to repeat key elements of the plan. All questions were answered to their satisfaction.  Minette Brine, FNP   I, Minette Brine, FNP, have reviewed all documentation for this visit. The documentation on 03/03/23 for the exam, diagnosis, procedures, and orders are all accurate and complete.   IF YOU HAVE BEEN REFERRED TO A SPECIALIST, IT MAY TAKE 1-2 WEEKS TO SCHEDULE/PROCESS THE REFERRAL. IF YOU HAVE NOT HEARD FROM US/SPECIALIST IN TWO WEEKS, PLEASE GIVE [place] A CALL AT [phone].   THE PATIENT IS ENCOURAGED TO PRACTICE SOCIAL DISTANCING DUE TO THE COVID-19 PANDEMIC.

## 2023-03-05 LAB — CBC
Hematocrit: 39 % (ref 34.0–46.6)
Hemoglobin: 13 g/dL (ref 11.1–15.9)
MCH: 31.1 pg (ref 26.6–33.0)
MCHC: 33.3 g/dL (ref 31.5–35.7)
MCV: 93 fL (ref 79–97)
Platelets: 254 10*3/uL (ref 150–450)
RBC: 4.18 x10E6/uL (ref 3.77–5.28)
RDW: 12.3 % (ref 11.7–15.4)
WBC: 6.4 10*3/uL (ref 3.4–10.8)

## 2023-03-05 LAB — CMP14+EGFR
ALT: 12 IU/L (ref 0–32)
AST: 17 IU/L (ref 0–40)
Albumin/Globulin Ratio: 1.5 (ref 1.2–2.2)
Albumin: 4.4 g/dL (ref 4.0–5.0)
Alkaline Phosphatase: 68 IU/L (ref 44–121)
BUN/Creatinine Ratio: 15 (ref 9–23)
BUN: 10 mg/dL (ref 6–20)
Bilirubin Total: <0.2 mg/dL (ref 0.0–1.2)
CO2: 23 mmol/L (ref 20–29)
Calcium: 9.3 mg/dL (ref 8.7–10.2)
Chloride: 103 mmol/L (ref 96–106)
Creatinine, Ser: 0.68 mg/dL (ref 0.57–1.00)
Globulin, Total: 2.9 g/dL (ref 1.5–4.5)
Glucose: 75 mg/dL (ref 70–99)
Potassium: 4.2 mmol/L (ref 3.5–5.2)
Sodium: 139 mmol/L (ref 134–144)
Total Protein: 7.3 g/dL (ref 6.0–8.5)
eGFR: 120 mL/min/{1.73_m2} (ref >59)

## 2023-03-05 LAB — MICROALBUMIN / CREATININE URINE RATIO
Creatinine, Urine: 175 mg/dL
Microalb/Creat Ratio: 7 mg/g creat (ref 0–29)
Microalbumin, Urine: 11.4 ug/mL

## 2023-03-05 LAB — LIPID PANEL
Chol/HDL Ratio: 2.7 ratio (ref 0.0–4.4)
Cholesterol, Total: 166 mg/dL (ref 100–199)
HDL: 62 mg/dL (ref >39)
LDL Chol Calc (NIH): 86 mg/dL (ref 0–99)
Triglycerides: 98 mg/dL (ref 0–149)
VLDL Cholesterol Cal: 18 mg/dL (ref 5–40)

## 2023-03-05 LAB — HEMOGLOBIN A1C
Est. average glucose Bld gHb Est-mCnc: 97 mg/dL
Hgb A1c MFr Bld: 5 % (ref 4.8–5.6)

## 2023-03-12 DIAGNOSIS — R221 Localized swelling, mass and lump, neck: Secondary | ICD-10-CM | POA: Insufficient documentation

## 2023-03-16 ENCOUNTER — Ambulatory Visit
Admission: RE | Admit: 2023-03-16 | Discharge: 2023-03-16 | Disposition: A | Discharge status: Home / Self Care | Payer: 59 | Source: Ambulatory Visit | Attending: Nurse Practitioner | Admitting: Nurse Practitioner

## 2023-03-16 DIAGNOSIS — R221 Localized swelling, mass and lump, neck: Secondary | ICD-10-CM

## 2023-03-17 ENCOUNTER — Other Ambulatory Visit: Payer: Self-pay | Admitting: Nurse Practitioner

## 2023-03-17 DIAGNOSIS — I10 Essential (primary) hypertension: Secondary | ICD-10-CM

## 2023-08-09 LAB — HM PAP SMEAR

## 2023-09-06 NOTE — Progress Notes (Unsigned)
Madelaine Bhat, CMA,acting as a Neurosurgeon for Arnette Felts, FNP.,have documented all relevant documentation on the behalf of Arnette Felts, FNP,as directed by  Arnette Felts, FNP while in the presence of Arnette Felts, FNP.  Subjective:  Patient ID: Laura Sutton , female    DOB: 09-19-1991 , 32 y.o.   MRN: [id number]  No chief complaint on file.   HPI  Patient presents today for a BP follow up, patient reports compliance with medications. Patient denies any chest pain, SOB, or headaches. Patient has no concerns today.       Past Medical History:  Diagnosis Date  . Asthma   . Cervical dysplasia   . Pregnancy induced hypertension    followed by dr Claiborne Billings (gyn)---- SVD 05-07-2020  . Pulmonary embolism ([facility])   . Wears contact lenses      No family history on file.   Current Outpatient Medications:  .  amLODipine (NORVASC) 5 MG tablet, TAKE 1 TABLET (5 MG TOTAL) BY MOUTH DAILY., Disp: 90 tablet, Rfl: 1 .  etonogestrel (NEXPLANON) 68 MG IMPL implant, 1 each by Subdermal route once., Disp: , Rfl:  .  fluticasone (FLONASE) 50 MCG/ACT nasal spray, Place 2 sprays into both nostrils daily. (Patient not taking: Reported on 10/12/2022), Disp: 16 g, Rfl: 2 .  scopolamine (TRANSDERM-SCOP) 1 MG/3DAYS, Place 1 patch (1.5 mg total) onto the skin every 3 (three) days. (Patient not taking: Reported on 10/12/2022), Disp: 4 patch, Rfl: 0   No Known Allergies   Review of Systems  Constitutional: Negative.   HENT: Negative.    Eyes: Negative.   Respiratory: Negative.    Cardiovascular: Negative.   Gastrointestinal: Negative.     There were no vitals filed for this visit. There is no height or weight on file to calculate BMI.  Wt Readings from Last 3 Encounters:  03/03/23 179 lb (81.2 kg)  10/12/22 176 lb 4.8 oz (80 kg)  06/10/22 172 lb (78 kg)    The ASCVD Risk score (Arnett DK, et al., 2019) failed to calculate for the following reasons:   The 2019 ASCVD risk score is only valid for  ages 51 to 66  Objective:  Physical Exam      Assessment And Plan:  Primary hypertension    No follow-ups on file.  Patient was given opportunity to ask questions. Patient verbalized understanding of the plan and was able to repeat key elements of the plan. All questions were answered to their satisfaction.    Jeanell Sparrow, FNP, have reviewed all documentation for this visit. The documentation on 09/06/23 for the exam, diagnosis, procedures, and orders are all accurate and complete.   IF YOU HAVE BEEN REFERRED TO A SPECIALIST, IT MAY TAKE 1-2 WEEKS TO SCHEDULE/PROCESS THE REFERRAL. IF YOU HAVE NOT HEARD FROM US/SPECIALIST IN TWO WEEKS, PLEASE GIVE [place] A CALL AT [phone].

## 2023-09-07 ENCOUNTER — Encounter: Payer: Self-pay | Admitting: Nurse Practitioner

## 2023-09-07 ENCOUNTER — Ambulatory Visit (INDEPENDENT_AMBULATORY_CARE_PROVIDER_SITE_OTHER): Payer: 59 | Admitting: Nurse Practitioner

## 2023-09-07 VITALS — BP 130/72 | HR 98 | Temp 98.7°F | Ht 63.0 in | Wt 181.0 lb

## 2023-09-07 DIAGNOSIS — Z6832 Body mass index (BMI) 32.0-32.9, adult: Secondary | ICD-10-CM | POA: Diagnosis not present

## 2023-09-07 DIAGNOSIS — Z2821 Immunization not carried out because of patient refusal: Secondary | ICD-10-CM | POA: Diagnosis not present

## 2023-09-07 DIAGNOSIS — E6609 Other obesity due to excess calories: Secondary | ICD-10-CM | POA: Diagnosis not present

## 2023-09-07 DIAGNOSIS — I158 Other secondary hypertension: Secondary | ICD-10-CM

## 2023-09-07 DIAGNOSIS — I10 Essential (primary) hypertension: Secondary | ICD-10-CM | POA: Insufficient documentation

## 2023-09-07 MED ORDER — AMLODIPINE BESYLATE 5 MG PO TABS
5.0000 mg | ORAL_TABLET | Freq: Every day | ORAL | 1 refills | Status: DC
Start: 2023-09-07 — End: 2024-03-21

## 2023-09-07 NOTE — Patient Instructions (Signed)
Keep exercising regularly and limiting your intake of salt.  Continue staying well hydrated with water.

## 2023-09-07 NOTE — Assessment & Plan Note (Signed)
She is encouraged to strive for BMI less than 30 to decrease cardiac risk. Advised to aim for at least 150 minutes of exercise per week.

## 2023-09-07 NOTE — Assessment & Plan Note (Signed)
Blood pressure is improving, continue current medications.

## 2023-09-08 LAB — BASIC METABOLIC PANEL
BUN/Creatinine Ratio: 11 (ref 9–23)
BUN: 9 mg/dL (ref 6–20)
CO2: 22 mmol/L (ref 20–29)
Calcium: 9.2 mg/dL (ref 8.7–10.2)
Chloride: 105 mmol/L (ref 96–106)
Creatinine, Ser: 0.83 mg/dL (ref 0.57–1.00)
Glucose: 85 mg/dL (ref 70–99)
Potassium: 4.2 mmol/L (ref 3.5–5.2)
Sodium: 142 mmol/L (ref 134–144)
eGFR: 97 mL/min/{1.73_m2} (ref >59)

## 2023-12-24 ENCOUNTER — Other Ambulatory Visit: Payer: Self-pay | Admitting: Nurse Practitioner

## 2023-12-24 DIAGNOSIS — G4452 New daily persistent headache (NDPH): Secondary | ICD-10-CM

## 2024-03-06 ENCOUNTER — Encounter: Payer: Self-pay | Admitting: Nurse Practitioner

## 2024-03-06 ENCOUNTER — Ambulatory Visit (INDEPENDENT_AMBULATORY_CARE_PROVIDER_SITE_OTHER): Payer: Self-pay | Admitting: Nurse Practitioner

## 2024-03-06 VITALS — BP 136/70 | Temp 98.4°F | Ht 63.0 in | Wt 188.4 lb

## 2024-03-06 DIAGNOSIS — Z79899 Other long term (current) drug therapy: Secondary | ICD-10-CM | POA: Diagnosis not present

## 2024-03-06 DIAGNOSIS — I158 Other secondary hypertension: Secondary | ICD-10-CM

## 2024-03-06 DIAGNOSIS — E66811 Obesity, class 1: Secondary | ICD-10-CM | POA: Diagnosis not present

## 2024-03-06 DIAGNOSIS — I10 Essential (primary) hypertension: Secondary | ICD-10-CM | POA: Diagnosis not present

## 2024-03-06 DIAGNOSIS — Z6833 Body mass index (BMI) 33.0-33.9, adult: Secondary | ICD-10-CM

## 2024-03-06 DIAGNOSIS — Z0000 Encounter for general adult medical examination without abnormal findings: Secondary | ICD-10-CM

## 2024-03-06 DIAGNOSIS — E6609 Other obesity due to excess calories: Secondary | ICD-10-CM

## 2024-03-06 LAB — POCT URINALYSIS DIP (CLINITEK)
Bilirubin, UA: NEGATIVE
Blood, UA: NEGATIVE
Glucose, UA: NEGATIVE mg/dL
Ketones, POC UA: NEGATIVE mg/dL
Leukocytes, UA: NEGATIVE
Nitrite, UA: NEGATIVE
POC PROTEIN,UA: NEGATIVE
Spec Grav, UA: 1.02 (ref 1.010–1.025)
Urobilinogen, UA: 1 U/dL
pH, UA: 7 (ref 5.0–8.0)

## 2024-03-06 NOTE — Progress Notes (Signed)
 Madelaine Bhat, CMA,acting as a Neurosurgeon for Arnette Felts, FNP.,have documented all relevant documentation on the behalf of Arnette Felts, FNP,as directed by  Arnette Felts, FNP while in the presence of Arnette Felts, FNP.  Subjective:    Patient ID: Laura Sutton , female    DOB: 05/12/1991 , 33 y.o.   MRN: [id number]  Chief Complaint  Patient presents with   Annual Exam    HPI  Patient presents today for HM, Patient reports compliance with medication. Patient denies any chest pain, SOB, or headaches. Patient has no concerns today. She is followed by [facility] her appt is next month on the 7th.      Past Medical History:  Diagnosis Date   Asthma    Cervical dysplasia    Chronic hypertension during pregnancy 05/02/2020   Pregnancy induced hypertension    followed by dr Claiborne Billings (gyn)---- SVD 05-07-2020   Pulmonary embolism ([facility])    Severe preeclampsia, third trimester 05/07/2020   Wears contact lenses      History reviewed. No pertinent family history.   Current Outpatient Medications:    amLODipine (NORVASC) 5 MG tablet, Take 1 tablet (5 mg total) by mouth daily., Disp: 90 tablet, Rfl: 1   etonogestrel (NEXPLANON) 68 MG IMPL implant, 1 each by Subdermal route once., Disp: , Rfl:    fluticasone (FLONASE) 50 MCG/ACT nasal spray, SPRAY 2 SPRAYS INTO EACH NOSTRIL EVERY DAY, Disp: 16 mL, Rfl: 2   scopolamine (TRANSDERM-SCOP) 1 MG/3DAYS, Place 1 patch (1.5 mg total) onto the skin every 3 (three) days. (Patient not taking: Reported on 03/06/2024), Disp: 4 patch, Rfl: 0   No Known Allergies    The patient states she uses Nexplanon for birth control. Patient's last menstrual period was 03/05/2024.Marland Kitchen She has spotting for her menstrual cycle.  Negative for: breast discharge, breast lump(s), breast pain and breast self exam. Associated symptoms include abnormal vaginal bleeding. Pertinent negatives include abnormal bleeding (hematology), anxiety, decreased libido, depression,  difficulty falling sleep, dyspareunia, history of infertility, nocturia, sexual dysfunction, sleep disturbances, urinary incontinence, urinary urgency, vaginal discharge and vaginal itching. Diet regular; she is limiting her fatty foods, high salt foods, and drinking water. The patient states her exercise level is walking.    Breakfast - yogurt with fruit, breakfast bar Lunch - will skip  Dinner - green beans, rice, chicken, fish, hamburger Snack - she will substitute her snack for fruit. Sleeping - approximately 6-7 hours, feels rested, she does snore. She does dose off if sitting watching TV.   Walks 5 days a week 45 minutes - one hour. No strength training.   Wt Readings from Last 3 Encounters:  03/06/24 188 lb 6.4 oz (85.5 kg)  09/07/23 181 lb (82.1 kg)  03/03/23 179 lb (81.2 kg)    The patient's tobacco use is:  Social History   Tobacco Use  Smoking Status Never  Smokeless Tobacco Never  . She has been exposed to passive smoke. The patient's alcohol use is:  Social History   Substance and Sexual Activity  Alcohol Use Yes   Alcohol/week: 1.0 standard drink of alcohol   Types: 1 Glasses of wine per week  . Additional information: Last pap 08/09/2023, next one scheduled for 08/08/2024.    Review of Systems  Constitutional: Negative.   HENT: Negative.    Eyes: Negative.   Respiratory: Negative.    Cardiovascular: Negative.   Gastrointestinal: Negative.   Endocrine: Negative.   Genitourinary: Negative.   Musculoskeletal: Negative.  Skin: Negative.   Allergic/Immunologic: Negative.   Neurological: Negative.   Hematological: Negative.   Psychiatric/Behavioral: Negative.       Today's Vitals   03/06/24 1529  BP: 136/70  Temp: 98.4 F (36.9 C)  TempSrc: Oral  Weight: 188 lb 6.4 oz (85.5 kg)  Height: 5\' 3"  (1.6 m)  PainSc: 0-No pain   Body mass index is 33.37 kg/m.  Wt Readings from Last 3 Encounters:  03/06/24 188 lb 6.4 oz (85.5 kg)  09/07/23 181 lb (82.1 kg)   03/03/23 179 lb (81.2 kg)     Objective:  Physical Exam Vitals and nursing note reviewed.  Constitutional:      General: She is not in acute distress.    Appearance: Normal appearance. She is well-developed. She is obese.  HENT:     Head: Normocephalic and atraumatic.     Right Ear: Hearing, tympanic membrane, ear canal and external ear normal. There is no impacted cerumen.     Left Ear: Hearing, tympanic membrane, ear canal and external ear normal. There is no impacted cerumen.     Nose: Nose normal.     Mouth/Throat:     Mouth: Mucous membranes are moist.  Eyes:     General: Lids are normal.     Extraocular Movements: Extraocular movements intact.     Conjunctiva/sclera: Conjunctivae normal.     Pupils: Pupils are equal, round, and reactive to light.     Funduscopic exam:    Right eye: No papilledema.        Left eye: No papilledema.  Neck:     Thyroid: No thyroid mass.     Vascular: No carotid bruit.     Comments: Neck fullness Cardiovascular:     Rate and Rhythm: Normal rate and regular rhythm.     Pulses: Normal pulses.     Heart sounds: Normal heart sounds. No murmur heard. Pulmonary:     Effort: Pulmonary effort is normal. No respiratory distress.     Breath sounds: Normal breath sounds. No wheezing.  Chest:     Chest wall: No mass.  Breasts:    Tanner Score is 5.     Right: Normal. No mass or tenderness.     Left: Normal. No mass or tenderness.  Abdominal:     General: Abdomen is flat. Bowel sounds are normal. There is no distension.     Palpations: Abdomen is soft.     Tenderness: There is no abdominal tenderness.  Musculoskeletal:        General: No swelling. Normal range of motion.     Cervical back: Full passive range of motion without pain, normal range of motion and neck supple. No edema.     Right lower leg: No edema.     Left lower leg: No edema.  Lymphadenopathy:     Upper Body:     Right upper body: No supraclavicular, axillary or pectoral  adenopathy.     Left upper body: No supraclavicular, axillary or pectoral adenopathy.  Skin:    General: Skin is warm and dry.     Capillary Refill: Capillary refill takes less than 2 seconds.  Neurological:     General: No focal deficit present.     Mental Status: She is alert and oriented to person, place, and time.     Cranial Nerves: No cranial nerve deficit.     Sensory: No sensory deficit.  Psychiatric:        Mood and Affect: Mood normal.  Behavior: Behavior normal.        Thought Content: Thought content normal.        Judgment: Judgment normal.         Assessment And Plan:     Encounter for annual health examination Assessment & Plan: Behavior modifications discussed and diet history reviewed.   Pt will continue to exercise regularly and modify diet with low GI, plant based foods and decrease intake of processed foods.  Recommend intake of daily multivitamin, Vitamin D, and calcium.  Recommend monthly self breast exams for preventive screenings, as well as recommend immunizations that include influenza, TDAP   Orders: -     Hemoglobin A1c -     Lipid panel  Primary hypertension Assessment & Plan: Blood pressure is fairly controlled, continue current medications.   Orders: -     EKG 12-Lead -     POCT URINALYSIS DIP (CLINITEK) -     Microalbumin / creatinine urine ratio -     CMP14+EGFR  Class 1 obesity due to excess calories without serious comorbidity with body mass index (BMI) of 33.0 to 33.9 in adult -     TSH  Other long term (current) drug therapy -     CBC with Differential/Platelet     Return for 1 year physical, 6 month bp check. Patient was given opportunity to ask questions. Patient verbalized understanding of the plan and was able to repeat key elements of the plan. All questions were answered to their satisfaction.   Arnette Felts, FNP  I, Arnette Felts, FNP, have reviewed all documentation for this visit. The documentation on 03/06/24  for the exam, diagnosis, procedures, and orders are all accurate and complete.

## 2024-03-06 NOTE — Patient Instructions (Signed)
 Goal to exercise 150 minutes per week with at least 2 days of strength training with light weights or resistance, get a jump rope.  Encouraged to park further when at the store, take stairs instead of elevators and to walk in place during commercials. Increase water intake to at least one gallon of water daily.

## 2024-03-07 ENCOUNTER — Encounter: Payer: Self-pay | Admitting: Nurse Practitioner

## 2024-03-07 LAB — CBC WITH DIFFERENTIAL/PLATELET
Basophils Absolute: 0 10*3/uL (ref 0.0–0.2)
Basos: 1 %
EOS (ABSOLUTE): 0.1 10*3/uL (ref 0.0–0.4)
Eos: 2 %
Hematocrit: 42.8 % (ref 34.0–46.6)
Hemoglobin: 13.6 g/dL (ref 11.1–15.9)
Immature Grans (Abs): 0 10*3/uL (ref 0.0–0.1)
Immature Granulocytes: 0 %
Lymphocytes Absolute: 2.9 10*3/uL (ref 0.7–3.1)
Lymphs: 44 %
MCH: 30.1 pg (ref 26.6–33.0)
MCHC: 31.8 g/dL (ref 31.5–35.7)
MCV: 95 fL (ref 79–97)
Monocytes Absolute: 0.4 10*3/uL (ref 0.1–0.9)
Monocytes: 6 %
Neutrophils Absolute: 3.1 10*3/uL (ref 1.4–7.0)
Neutrophils: 47 %
Platelets: 294 10*3/uL (ref 150–450)
RBC: 4.52 x10E6/uL (ref 3.77–5.28)
RDW: 12.4 % (ref 11.7–15.4)
WBC: 6.5 10*3/uL (ref 3.4–10.8)

## 2024-03-07 LAB — HEMOGLOBIN A1C
Est. average glucose Bld gHb Est-mCnc: 103 mg/dL
Hgb A1c MFr Bld: 5.2 % (ref 4.8–5.6)

## 2024-03-07 LAB — CMP14+EGFR
ALT: 16 IU/L (ref 0–32)
AST: 23 IU/L (ref 0–40)
Albumin: 4.6 g/dL (ref 3.9–4.9)
Alkaline Phosphatase: 78 IU/L (ref 44–121)
BUN/Creatinine Ratio: 15 (ref 9–23)
BUN: 11 mg/dL (ref 6–20)
Bilirubin Total: 0.3 mg/dL (ref 0.0–1.2)
CO2: 21 mmol/L (ref 20–29)
Calcium: 9.3 mg/dL (ref 8.7–10.2)
Chloride: 102 mmol/L (ref 96–106)
Creatinine, Ser: 0.72 mg/dL (ref 0.57–1.00)
Globulin, Total: 2.9 g/dL (ref 1.5–4.5)
Glucose: 80 mg/dL (ref 70–99)
Potassium: 3.9 mmol/L (ref 3.5–5.2)
Sodium: 139 mmol/L (ref 134–144)
Total Protein: 7.5 g/dL (ref 6.0–8.5)
eGFR: 115 mL/min/{1.73_m2} (ref >59)

## 2024-03-07 LAB — LIPID PANEL
Chol/HDL Ratio: 3.6 ratio (ref 0.0–4.4)
Cholesterol, Total: 185 mg/dL (ref 100–199)
HDL: 51 mg/dL (ref >39)
LDL Chol Calc (NIH): 111 mg/dL — ABNORMAL HIGH (ref 0–99)
Triglycerides: 131 mg/dL (ref 0–149)
VLDL Cholesterol Cal: 23 mg/dL (ref 5–40)

## 2024-03-07 LAB — MICROALBUMIN / CREATININE URINE RATIO
Creatinine, Urine: 71.8 mg/dL
Microalb/Creat Ratio: 18 mg/g{creat} (ref 0–29)
Microalbumin, Urine: 13.2 ug/mL

## 2024-03-07 LAB — TSH: TSH: 0.699 u[IU]/mL (ref 0.450–4.500)

## 2024-03-18 ENCOUNTER — Encounter: Payer: Self-pay | Admitting: Nurse Practitioner

## 2024-03-18 DIAGNOSIS — E66811 Obesity, class 1: Secondary | ICD-10-CM | POA: Insufficient documentation

## 2024-03-18 DIAGNOSIS — Z0000 Encounter for general adult medical examination without abnormal findings: Secondary | ICD-10-CM | POA: Insufficient documentation

## 2024-03-18 NOTE — Assessment & Plan Note (Signed)
 She is encouraged to strive for BMI less than 30 to decrease cardiac risk. Advised to aim for at least 150 minutes of exercise per week.

## 2024-03-18 NOTE — Assessment & Plan Note (Signed)
 Behavior modifications discussed and diet history reviewed.   Pt will continue to exercise regularly and modify diet with low GI, plant based foods and decrease intake of processed foods.  Recommend intake of daily multivitamin, Vitamin D, and calcium.  Recommend monthly self breast exams for preventive screenings, as well as recommend immunizations that include influenza, TDAP

## 2024-03-18 NOTE — Assessment & Plan Note (Signed)
Blood pressure is fairly controlled, continue current medications.  

## 2024-03-21 ENCOUNTER — Other Ambulatory Visit: Payer: Self-pay | Admitting: Nurse Practitioner

## 2024-03-21 DIAGNOSIS — I158 Other secondary hypertension: Secondary | ICD-10-CM

## 2024-03-30 LAB — HM PAP SMEAR: HPV, high-risk: NEGATIVE

## 2024-09-06 ENCOUNTER — Encounter: Payer: Self-pay | Admitting: Nurse Practitioner

## 2024-09-06 ENCOUNTER — Ambulatory Visit (INDEPENDENT_AMBULATORY_CARE_PROVIDER_SITE_OTHER): Admitting: Nurse Practitioner

## 2024-09-06 VITALS — BP 136/70 | HR 91 | Temp 98.2°F | Ht 63.0 in | Wt 194.8 lb

## 2024-09-06 DIAGNOSIS — Z139 Encounter for screening, unspecified: Secondary | ICD-10-CM

## 2024-09-06 DIAGNOSIS — I10 Essential (primary) hypertension: Secondary | ICD-10-CM

## 2024-09-06 DIAGNOSIS — Z2821 Immunization not carried out because of patient refusal: Secondary | ICD-10-CM

## 2024-09-06 DIAGNOSIS — Z6834 Body mass index (BMI) 34.0-34.9, adult: Secondary | ICD-10-CM

## 2024-09-06 DIAGNOSIS — E6609 Other obesity due to excess calories: Secondary | ICD-10-CM | POA: Diagnosis not present

## 2024-09-06 DIAGNOSIS — E66811 Obesity, class 1: Secondary | ICD-10-CM | POA: Diagnosis not present

## 2024-09-06 MED ORDER — AMLODIPINE BESYLATE 5 MG PO TABS: 5.0000 mg | ORAL_TABLET | Freq: Every day | ORAL | 1 refills | Status: AC

## 2024-09-06 NOTE — Progress Notes (Signed)
 LILLETTE Kristeen JINNY Gladis, CMA,acting as a Neurosurgeon for Laura Ada, FNP.,have documented all relevant documentation on the behalf of Laura Ada, FNP,as directed by  Laura Ada, FNP while in the presence of Laura Ada, FNP.  Subjective:  Patient ID: Laura Sutton , female    DOB: 1991-10-12 , 33 y.o.   MRN: [id number]  Chief Complaint  Patient presents with   Hypertension    Patient presents today for a bp follow up, Patient reports compliance with medication. Patient denies any chest pain, SOB, or headaches. Patient has no concerns today.      HPI  Discussed the use of AI scribe software for clinical note transcription with the patient, who gave verbal consent to proceed.  History of Present Illness Laura Sutton is a 33 year old female with hypertension who presents for a blood pressure check.  Her blood pressure was initially 140/70 mmHg but decreased to 136/70 mmHg after retaking it. She takes amlodipine  5 mg daily and confirms adherence to her medication regimen. She has noticed weight gain, which she attributes to a poor diet. She often skips meals at work and resorts to [employer] due to convenience. She wants to improve her eating habits by packing lunches to avoid fast food.  She acknowledges not engaging in regular physical activity, citing work-life balance as a barrier.  She is planning to move to [place] around Thanksgiving to be closer to family in [place], which she hopes will provide more support with her children. Her parents reside in [place], and she currently drives an hour to work and two hours to visit family.   Past Medical History:  Diagnosis Date   Asthma    Cervical dysplasia    Chronic hypertension during pregnancy 05/02/2020   Pregnancy induced hypertension    followed by dr lilton (gyn)---- SVD 05-07-2020   Pulmonary embolism (HCC)    Severe preeclampsia, third trimester 05/07/2020   Wears contact lenses      History reviewed. No  pertinent family history.   Current Outpatient Medications:    etonogestrel (NEXPLANON) 68 MG IMPL implant, 1 each by Subdermal route once., Disp: , Rfl:    amLODipine  (NORVASC ) 5 MG tablet, Take 1 tablet (5 mg total) by mouth daily., Disp: 90 tablet, Rfl: 1   fluticasone  (FLONASE ) 50 MCG/ACT nasal spray, SPRAY 2 SPRAYS INTO EACH NOSTRIL EVERY DAY (Patient not taking: Reported on 09/06/2024), Disp: 16 mL, Rfl: 2   scopolamine  (TRANSDERM-SCOP) 1 MG/3DAYS, Place 1 patch (1.5 mg total) onto the skin every 3 (three) days. (Patient not taking: Reported on 09/06/2024), Disp: 4 patch, Rfl: 0   No Known Allergies   Review of Systems  Constitutional: Negative.   Respiratory: Negative.    Cardiovascular: Negative.  Negative for chest pain, palpitations and leg swelling.  Neurological: Negative.   Psychiatric/Behavioral: Negative.       Today's Vitals   09/06/24 0848 09/06/24 0916  BP: (!) 140/70 136/70  Pulse: 91 91  Temp: 98.2 F (36.8 C)   TempSrc: Oral   Weight: 194 lb 12.8 oz (88.4 kg)   Height: 5' 3 (1.6 m)   PainSc: 0-No pain    Body mass index is 34.51 kg/m.  Wt Readings from Last 3 Encounters:  09/06/24 194 lb 12.8 oz (88.4 kg)  03/06/24 188 lb 6.4 oz (85.5 kg)  09/07/23 181 lb (82.1 kg)     Objective:  Physical Exam Vitals and nursing note reviewed.  Constitutional:      General: She is not  in acute distress.    Appearance: Normal appearance. She is obese.  Cardiovascular:     Rate and Rhythm: Normal rate and regular rhythm.     Pulses: Normal pulses.     Heart sounds: Normal heart sounds. No murmur heard. Pulmonary:     Effort: Pulmonary effort is normal. No respiratory distress.     Breath sounds: Normal breath sounds. No wheezing.  Skin:    General: Skin is warm and dry.     Capillary Refill: Capillary refill takes less than 2 seconds.  Neurological:     General: No focal deficit present.     Mental Status: She is alert and oriented to person, place, and time.      Cranial Nerves: No cranial nerve deficit.     Motor: No weakness.       Assessment And Plan:  Primary hypertension Assessment & Plan: Blood pressure improved with retesting. Adherent to amlodipine . Sedentary lifestyle and diet may contribute. - Refill amlodipine  5 mg daily for 90 days, send to [facility] on [address]. - Encouraged walking program or in-home exercises. - Check kidney function. - Discussed dietary modifications to reduce fast food and promote healthier snacks.  Orders: -     BMP8+eGFR -     amLODIPine  Besylate; Take 1 tablet (5 mg total) by mouth daily.  Dispense: 90 tablet; Refill: 1  Influenza vaccination declined  Class 1 obesity due to excess calories with body mass index (BMI) of 34.0 to 34.9 in adult, unspecified whether serious comorbidity present Assessment & Plan: Weight gain noted. Lack of exercise and poor diet are contributing factors. - Encouraged regular physical activity, starting with walking or in-home exercises. - Discussed dietary changes, including packing lunches and healthier snacks.   Encounter for screening -     Hepatitis B surface antibody,qualitative    Return for KEEP SAME NEXT.  Patient was given opportunity to ask questions. Patient verbalized understanding of the plan and was able to repeat key elements of the plan. All questions were answered to their satisfaction.    LILLETTE Laura Ada, FNP, have reviewed all documentation for this visit. The documentation on 09/06/24 for the exam, diagnosis, procedures, and orders are all accurate and complete.   IF YOU HAVE BEEN REFERRED TO A SPECIALIST, IT MAY TAKE 1-2 WEEKS TO SCHEDULE/PROCESS THE REFERRAL. IF YOU HAVE NOT HEARD FROM US /SPECIALIST IN TWO WEEKS, PLEASE GIVE US  A CALL AT [phone].

## 2024-09-06 NOTE — Assessment & Plan Note (Signed)
 Weight gain noted. Lack of exercise and poor diet are contributing factors. - Encouraged regular physical activity, starting with walking or in-home exercises. - Discussed dietary changes, including packing lunches and healthier snacks.

## 2024-09-06 NOTE — Assessment & Plan Note (Signed)
 Blood pressure improved with retesting. Adherent to amlodipine . Sedentary lifestyle and diet may contribute. - Refill amlodipine  5 mg daily for 90 days, [facility] [address]. - Encouraged walking program or in-home exercises. - Check kidney function. - Discussed dietary modifications to reduce fast food and promote healthier snacks.

## 2024-09-07 LAB — BMP8+EGFR
BUN/Creatinine Ratio: 24 — ABNORMAL HIGH (ref 9–23)
BUN: 19 mg/dL (ref 6–20)
CO2: 19 mmol/L — ABNORMAL LOW (ref 20–29)
Calcium: 9.3 mg/dL (ref 8.7–10.2)
Chloride: 105 mmol/L (ref 96–106)
Creatinine, Ser: 0.79 mg/dL (ref 0.57–1.00)
Glucose: 83 mg/dL (ref 70–99)
Potassium: 4.4 mmol/L (ref 3.5–5.2)
Sodium: 140 mmol/L (ref 134–144)
eGFR: 102 mL/min/1.73 (ref >59)

## 2024-09-07 LAB — HEPATITIS B SURFACE ANTIBODY,QUALITATIVE: Hep B Surface Ab, Qual: NONREACTIVE

## 2025-03-07 ENCOUNTER — Encounter: Payer: Self-pay | Admitting: Nurse Practitioner
